# Patient Record
Sex: Male | Born: 1965 | Race: White | Hispanic: No | Marital: Married | Smoking: Never smoker
Health system: Southern US, Community
[De-identification: ages and names within clinical notes are randomized; demographics above are authoritative.]

## PROBLEM LIST (undated history)

## (undated) DIAGNOSIS — E291 Testicular hypofunction: Secondary | ICD-10-CM

## (undated) DIAGNOSIS — I10 Essential (primary) hypertension: Secondary | ICD-10-CM

## (undated) DIAGNOSIS — M1990 Unspecified osteoarthritis, unspecified site: Secondary | ICD-10-CM

## (undated) DIAGNOSIS — E785 Hyperlipidemia, unspecified: Secondary | ICD-10-CM

## (undated) DIAGNOSIS — K429 Umbilical hernia without obstruction or gangrene: Secondary | ICD-10-CM

## (undated) HISTORY — DX: Umbilical hernia without obstruction or gangrene: K42.9

## (undated) HISTORY — DX: Essential (primary) hypertension: I10

## (undated) HISTORY — PX: CORONARY ARTERY BYPASS GRAFT: SHX141

## (undated) HISTORY — PX: OTHER SURGICAL HISTORY: SHX169

## (undated) HISTORY — DX: Testicular hypofunction: E29.1

## (undated) HISTORY — DX: Hyperlipidemia, unspecified: E78.5

## (undated) HISTORY — DX: Unspecified osteoarthritis, unspecified site: M19.90

## (undated) HISTORY — PX: CARDIAC CATHETERIZATION: SHX172

---
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
## 1985-05-15 HISTORY — PX: WRIST SURGERY: SHX841

## 2008-10-13 ENCOUNTER — Ambulatory Visit: Payer: Self-pay | Admitting: Family Medicine

## 2008-10-13 DIAGNOSIS — E785 Hyperlipidemia, unspecified: Secondary | ICD-10-CM

## 2008-10-13 DIAGNOSIS — I10 Essential (primary) hypertension: Secondary | ICD-10-CM | POA: Insufficient documentation

## 2011-04-03 ENCOUNTER — Ambulatory Visit (INDEPENDENT_AMBULATORY_CARE_PROVIDER_SITE_OTHER): Payer: BC Managed Care – PPO | Admitting: General Surgery

## 2011-04-03 ENCOUNTER — Encounter (INDEPENDENT_AMBULATORY_CARE_PROVIDER_SITE_OTHER): Payer: Self-pay | Admitting: General Surgery

## 2011-04-19 ENCOUNTER — Encounter (INDEPENDENT_AMBULATORY_CARE_PROVIDER_SITE_OTHER): Payer: Self-pay | Admitting: General Surgery

## 2011-04-19 ENCOUNTER — Ambulatory Visit (INDEPENDENT_AMBULATORY_CARE_PROVIDER_SITE_OTHER): Payer: BC Managed Care – PPO | Admitting: General Surgery

## 2011-04-19 VITALS — BP 160/92 | HR 60 | Temp 97.0°F | Resp 18 | Ht 70.0 in | Wt 231.1 lb

## 2011-04-19 DIAGNOSIS — K420 Umbilical hernia with obstruction, without gangrene: Secondary | ICD-10-CM

## 2011-04-19 NOTE — Progress Notes (Signed)
Patient ID: Edward Phillips, male   DOB: 09/17/1965, 45 y.o.   MRN: [id number]  Chief Complaint  Patient presents with  . New Evaluation    eval of umbilical hernia    HPI Edward Phillips is a 45 y.o. male.   HPI This patient was seen for evaluation of an umbilical hernia. He states that he first noticed this 2-3 years ago as an umbilical bulge. It has been increasing in size but he does not remember a particular inciting event tocolysis. He does have a history of body building. He states it causes occasional discomfort but is not lifestyle limiting. He states his bowels are normal she denies any nausea vomiting or stroke symptoms. He is in generally good health and does not complain of any shortness of breath and he had a heart catheter approximately 10 years ago at [facility]. Past Medical History  Diagnosis Date  . Hypertension   . Hyperlipidemia     Past Surgical History  Procedure Date  . Nasal septum surgery 1983  . Wrist surgery 1987    tendon surgery   . Cardiac catheterization 2000?    History reviewed. No pertinent family history.  Social History History  Substance Use Topics  . Smoking status: Never Smoker   . Smokeless tobacco: Never Used  . Alcohol Use: Yes    No Known Allergies  Current Outpatient Prescriptions  Medication Sig Dispense Refill  . lisinopril (PRINIVIL,ZESTRIL) 20 MG tablet       . metoprolol (TOPROL-XL) 50 MG 24 hr tablet       . pravastatin (PRAVACHOL) 40 MG tablet         Review of Systems Review of Systems  All other systems reviewed and are negative.    Blood pressure 160/92, pulse 60, temperature 97 F (36.1 C), temperature source Temporal, resp. rate 18, height 5\' 10"  (1.778 m), weight 231 lb 2 oz (104.838 kg).  Physical Exam Physical Exam  Vitals reviewed. Constitutional: He is oriented to person, place, and time. He appears well-developed and well-nourished. No distress.  HENT:  Head: Normocephalic and atraumatic.    Mouth/Throat: No oropharyngeal exudate.  Eyes: Conjunctivae are normal. Pupils are equal, round, and reactive to light. Right eye exhibits no discharge. Left eye exhibits no discharge. No scleral icterus.  Neck: Normal range of motion. Neck supple. No tracheal deviation present.  Cardiovascular: Normal rate, regular rhythm and normal heart sounds.   Pulmonary/Chest: Effort normal and breath sounds normal. No stridor. No respiratory distress. He has no wheezes.  Abdominal: Soft. Bowel sounds are normal. He exhibits mass. He exhibits no distension. There is no tenderness. There is no rebound and no guarding.       Nontender, and partially reducible umbilical hernia  Musculoskeletal: Normal range of motion. He exhibits no edema and no tenderness.  Neurological: He is alert and oriented to person, place, and time. No cranial nerve deficit.  Skin: Skin is warm and dry. No rash noted. He is not diaphoretic. No erythema. No pallor.  Psychiatric: He has a normal mood and affect. His behavior is normal. Judgment and thought content normal.    Data Reviewed   Assessment    Umbilical hernia  He does have a mildly symptomatic umbilical hernia on exam. It is only partially reducible on my exam today. I discussed the options for continued observation versus open or laparoscopic repair and have recommended open repair likely with mesh. We discussed the risks of the procedure including infection, bleeding,  pain, scarring, recurrence, poor cosmesis, bowel injury, and need for future surgery and he expressed understanding. He was not to proceed with open umbilical hernia repair.    Plan    We will schedule him for open umbilical hernia repair with possible mesh when available.       Lodema Pilot DAVID 04/19/2011, 1:48 PM

## 2011-05-04 ENCOUNTER — Telehealth (INDEPENDENT_AMBULATORY_CARE_PROVIDER_SITE_OTHER): Payer: Self-pay

## 2011-05-04 NOTE — Telephone Encounter (Signed)
Left message for patient to call our office regarding appointment (pre-op appt)

## 2011-05-26 ENCOUNTER — Encounter (INDEPENDENT_AMBULATORY_CARE_PROVIDER_SITE_OTHER): Payer: BC Managed Care – PPO | Admitting: General Surgery

## 2011-06-05 ENCOUNTER — Encounter (INDEPENDENT_AMBULATORY_CARE_PROVIDER_SITE_OTHER): Payer: Self-pay | Admitting: General Surgery

## 2014-06-06 ENCOUNTER — Emergency Department (HOSPITAL_BASED_OUTPATIENT_CLINIC_OR_DEPARTMENT_OTHER): Payer: BLUE CROSS/BLUE SHIELD

## 2014-06-06 ENCOUNTER — Emergency Department (HOSPITAL_BASED_OUTPATIENT_CLINIC_OR_DEPARTMENT_OTHER)
Admission: EM | Admit: 2014-06-06 | Discharge: 2014-06-06 | Disposition: A | Discharge status: Home / Self Care | Payer: BLUE CROSS/BLUE SHIELD | Attending: Emergency Medicine | Admitting: Emergency Medicine

## 2014-06-06 ENCOUNTER — Encounter (HOSPITAL_BASED_OUTPATIENT_CLINIC_OR_DEPARTMENT_OTHER): Payer: Self-pay | Admitting: *Deleted

## 2014-06-06 DIAGNOSIS — Z79899 Other long term (current) drug therapy: Secondary | ICD-10-CM | POA: Insufficient documentation

## 2014-06-06 DIAGNOSIS — R109 Unspecified abdominal pain: Secondary | ICD-10-CM

## 2014-06-06 DIAGNOSIS — Z9889 Other specified postprocedural states: Secondary | ICD-10-CM | POA: Insufficient documentation

## 2014-06-06 DIAGNOSIS — I10 Essential (primary) hypertension: Secondary | ICD-10-CM | POA: Diagnosis not present

## 2014-06-06 DIAGNOSIS — E785 Hyperlipidemia, unspecified: Secondary | ICD-10-CM | POA: Insufficient documentation

## 2014-06-06 DIAGNOSIS — N200 Calculus of kidney: Secondary | ICD-10-CM | POA: Diagnosis not present

## 2014-06-06 LAB — CBC WITH DIFFERENTIAL/PLATELET
BASOS PCT: 1 % (ref 0–1)
Basophils Absolute: 0.1 10*3/uL (ref 0.0–0.1)
EOS ABS: 0.1 10*3/uL (ref 0.0–0.7)
EOS PCT: 0 % (ref 0–5)
HEMATOCRIT: 46 % (ref 39.0–52.0)
HEMOGLOBIN: 15.6 g/dL (ref 13.0–17.0)
LYMPHS ABS: 1.8 10*3/uL (ref 0.7–4.0)
Lymphocytes Relative: 14 % (ref 12–46)
MCH: 30.6 pg (ref 26.0–34.0)
MCHC: 33.9 g/dL (ref 30.0–36.0)
MCV: 90.4 fL (ref 78.0–100.0)
Monocytes Absolute: 0.7 10*3/uL (ref 0.1–1.0)
Monocytes Relative: 6 % (ref 3–12)
NEUTROS ABS: 10 10*3/uL — AB (ref 1.7–7.7)
Neutrophils Relative %: 79 % — ABNORMAL HIGH (ref 43–77)
Platelets: 179 10*3/uL (ref 150–400)
RBC: 5.09 MIL/uL (ref 4.22–5.81)
RDW: 12.7 % (ref 11.5–15.5)
WBC: 12.6 10*3/uL — AB (ref 4.0–10.5)

## 2014-06-06 LAB — BASIC METABOLIC PANEL
ANION GAP: 7 (ref 5–15)
BUN: 13 mg/dL (ref 6–23)
CO2: 26 mmol/L (ref 19–32)
CREATININE: 1.02 mg/dL (ref 0.50–1.35)
Calcium: 9.2 mg/dL (ref 8.4–10.5)
Chloride: 103 mmol/L (ref 96–112)
GFR calc non Af Amer: 84 mL/min — ABNORMAL LOW (ref >90)
Glucose, Bld: 134 mg/dL — ABNORMAL HIGH (ref 70–99)
Potassium: 3.8 mmol/L (ref 3.5–5.1)
Sodium: 136 mmol/L (ref 135–145)

## 2014-06-06 LAB — URINALYSIS, ROUTINE W REFLEX MICROSCOPIC
Bilirubin Urine: NEGATIVE
GLUCOSE, UA: NEGATIVE mg/dL
KETONES UR: NEGATIVE mg/dL
Leukocytes, UA: NEGATIVE
Nitrite: NEGATIVE
PH: 7.5 (ref 5.0–8.0)
Protein, ur: 30 mg/dL — AB
Specific Gravity, Urine: 1.02 (ref 1.005–1.030)
Urobilinogen, UA: 0.2 mg/dL (ref 0.0–1.0)

## 2014-06-06 LAB — URINE MICROSCOPIC-ADD ON

## 2014-06-06 MED ORDER — ONDANSETRON HCL 4 MG/2ML IJ SOLN
4.0000 mg | Freq: Once | INTRAMUSCULAR | Status: AC
  Administered 2014-06-06: 4 mg via INTRAVENOUS
  Filled 2014-06-06: qty 2

## 2014-06-06 MED ORDER — HYDROMORPHONE HCL 1 MG/ML IJ SOLN
1.0000 mg | Freq: Once | INTRAMUSCULAR | Status: AC
  Administered 2014-06-06: 1 mg via INTRAVENOUS
  Filled 2014-06-06: qty 1

## 2014-06-06 MED ORDER — KETOROLAC TROMETHAMINE 30 MG/ML IJ SOLN
15.0000 mg | Freq: Once | INTRAMUSCULAR | Status: AC
  Administered 2014-06-06: 15 mg via INTRAVENOUS
  Filled 2014-06-06: qty 1

## 2014-06-06 MED ORDER — KETOROLAC TROMETHAMINE 10 MG PO TABS: 10.0000 mg | ORAL_TABLET | Freq: Four times a day (QID) | ORAL | Status: DC | PRN

## 2014-06-06 MED ORDER — SODIUM CHLORIDE 0.9 % IV SOLN
INTRAVENOUS | Status: DC
  Administered 2014-06-06: 15:00:00 via INTRAVENOUS

## 2014-06-06 MED ORDER — HYDROMORPHONE HCL 1 MG/ML IJ SOLN
0.5000 mg | Freq: Once | INTRAMUSCULAR | Status: AC
  Administered 2014-06-06: 0.5 mg via INTRAVENOUS
  Filled 2014-06-06: qty 1

## 2014-06-06 MED ORDER — ONDANSETRON HCL 4 MG PO TABS: 4.0000 mg | ORAL_TABLET | Freq: Three times a day (TID) | ORAL | Status: DC | PRN

## 2014-06-06 MED ORDER — OXYCODONE-ACETAMINOPHEN 5-325 MG PO TABS: ORAL_TABLET | ORAL | Status: DC

## 2014-06-06 NOTE — ED Notes (Addendum)
Pt reports L back pain radiating to L groin that began around 1215 today; reports hx of kidney stones (reports it feels the same); reports blood in urine x1, difficult and painful urination; reports n/v; denies fever. Pt received Fentanyl 250 mcg IV in route.

## 2014-06-06 NOTE — Discharge Instructions (Signed)
Push fluids: take small frequent sips of water or Gatorade, do not drink any soda, juice or caffeinated beverages.  Take percocet for breakthrough pain, do not drink alcohol, drive, care for children or do other critical tasks while taking percocet.  Do not combine ketorolac (toradol) with any other NSAID (motrin, ibuprofen, Advil, aleve , aspirin, naproxen etc.) Take fist ketorolac pill tomorrow, you have already had a shot today  Return to the ED if you develop fever, have vomiting that is not controlled with the medication or if pain becomes too severe.  Please follow with your primary care doctor in the next 2 days for a check-up. They must obtain records for further management.   Do not hesitate to return to the Emergency Department for any new, worsening or concerning symptoms.

## 2014-06-06 NOTE — ED Provider Notes (Signed)
CSN: [id number]     Arrival date & time 06/06/14  1412 History   First MD Initiated Contact with Patient 06/06/14 1422     Chief Complaint  Patient presents with  . Abdominal Pain     (Consider location/radiation/quality/duration/timing/severity/associated sxs/prior Treatment) HPI   Edward Phillips is a 49 y.o. male complaining of severe left flank pain radiating around to the left groin onset at 12:15 today associated with hematuria feels like prior kidney stones. Patient had to have surgical intervention for kidney stones in the past. [place]. Endorses nausea with no vomiting. Patient denies fever or chills, dysuria.   Past Medical History  Diagnosis Date  . Hypertension   . Hyperlipidemia    Past Surgical History  Procedure Laterality Date  . Nasal septum surgery  1983  . Wrist surgery  1987    tendon surgery   . Cardiac catheterization  2000?   No family history on file. History  Substance Use Topics  . Smoking status: Never Smoker   . Smokeless tobacco: Never Used  . Alcohol Use: Yes     Comment: occasional    Review of Systems  10 systems reviewed and found to be negative, except as noted in the HPI.   Allergies  Review of patient's allergies indicates no known allergies.  Home Medications   Prior to Admission medications   Medication Sig Start Date End Date Taking? Authorizing Provider  FLUoxetine (PROZAC) 40 MG capsule Take 40 mg by mouth daily.   Yes Historical Provider, MD  lisinopril (PRINIVIL,ZESTRIL) 20 MG tablet  04/14/11   Historical Provider, MD  metoprolol (TOPROL-XL) 50 MG 24 hr tablet  04/14/11   Historical Provider, MD  pravastatin (PRAVACHOL) 40 MG tablet  04/19/11   Historical Provider, MD   BP 188/95 mmHg  Pulse 85  Temp(Src) 98.7 F (37.1 C) (Oral)  Resp 20  Ht 5\' 10"  (1.778 m)  Wt 220 lb (99.791 kg)  BMI 31.57 kg/m2  SpO2 95% Physical Exam  Constitutional: He is oriented to person, place, and time.  He appears well-developed and well-nourished.  Writhing,  Appears uncomfortable  HENT:  Head: Normocephalic and atraumatic.  Mouth/Throat: Oropharynx is clear and moist.  Eyes: Conjunctivae and EOM are normal. Pupils are equal, round, and reactive to light.  Neck: Normal range of motion.  Cardiovascular: Normal rate, regular rhythm and intact distal pulses.   Pulmonary/Chest: Effort normal and breath sounds normal. No stridor. No respiratory distress. He has no wheezes. He has no rales. He exhibits no tenderness.  Abdominal: Soft. Bowel sounds are normal. He exhibits no distension and no mass. There is no tenderness. There is no rebound and no guarding.  Genitourinary:  No CVA tenderness bilaterally  Musculoskeletal: Normal range of motion.  Neurological: He is alert and oriented to person, place, and time.  Psychiatric: He has a normal mood and affect.  Nursing note and vitals reviewed.   ED Course  Procedures (including critical care time) Labs Review Labs Reviewed  CBC WITH DIFFERENTIAL/PLATELET - Abnormal; Notable for the following:    WBC 12.6 (*)    Neutrophils Relative % 79 (*)    Neutro Abs 10.0 (*)    All other components within normal limits  BASIC METABOLIC PANEL - Abnormal; Notable for the following:    Glucose, Bld 134 (*)    GFR calc non Af Amer 84 (*)    All other components within normal limits  URINALYSIS, ROUTINE W REFLEX MICROSCOPIC -  Abnormal; Notable for the following:    APPearance CLOUDY (*)    Hgb urine dipstick LARGE (*)    Protein, ur 30 (*)    All other components within normal limits  URINE MICROSCOPIC-ADD ON - Abnormal; Notable for the following:    Bacteria, UA MANY (*)    Crystals CA OXALATE CRYSTALS (*)    All other components within normal limits    Imaging Review No results found.   EKG Interpretation None      MDM   Final diagnoses:  Left flank pain  Kidney stone on left side    Filed Vitals:   06/06/14 1414 06/06/14 1422  06/06/14 1547 06/06/14 1605  BP: 188/95  184/105 160/91  Pulse: 85  94   Temp: 98.7 F (37.1 C)  98.5 F (36.9 C)   TempSrc: Oral  Oral   Resp: 20  24   Height:   (1.778 m)    Weight:  220 lb (99.791 kg)    SpO2: 95%  96%     Medications  0.9 %  sodium chloride infusion ( Intravenous Stopped 06/06/14 1618)  HYDROmorphone (DILAUDID) injection 0.5 mg (0.5 mg Intravenous Given 06/06/14 1438)  ondansetron (ZOFRAN) injection 4 mg (4 mg Intravenous Given 06/06/14 1436)  HYDROmorphone (DILAUDID) injection 1 mg (1 mg Intravenous Given 06/06/14 1514)  ketorolac (TORADOL) 30 MG/ML injection 15 mg (15 mg Intravenous Given 06/06/14 1545)    Edward Phillips is a pleasant 49 y.o. male presenting with flank pain similar to prior episodes of kidney stones. Patient is afebrile, well-appearing. Urinalysis is not consistent with infection, no leukocytes or nitrite however there are many bacteria. Urinalysis will be cultured. Patient's pain is controlled to 5 out of 10 with Dilaudid and Toradol. Patient follows with urology in [place].  Evaluation does not show pathology that would require ongoing emergent intervention or inpatient treatment. Pt is hemodynamically stable and mentating appropriately. Discussed findings and plan with patient/guardian, who agrees with care plan. All questions answered. Return precautions discussed and outpatient follow up given.   Discharge Medication List as of 06/06/2014  4:06 PM    START taking these medications   Details  ketorolac (TORADOL) 10 MG tablet Take 1 tablet (10 mg total) by mouth every 6 (six) hours as needed (Take with food. Do not take more than 4 per day. Do not take for longer than 5 days)., Starting 06/06/2014, Until Discontinued, Print    ondansetron (ZOFRAN) 4 MG tablet Take 1 tablet (4 mg total) by mouth every 8 (eight) hours as needed for nausea or vomiting., Starting 06/06/2014, Until Discontinued, Print    oxyCODONE-acetaminophen  (PERCOCET/ROXICET) 5-325 MG per tablet 1 to 2 tabs PO q4-6hrs  PRN for pain, Print             Wynetta Emery, PA-C 06/06/14 1710  Hilario Quarry, MD 06/09/14 [phone]

## 2014-06-07 LAB — URINE CULTURE
Colony Count: NO GROWTH
Culture: NO GROWTH

## 2016-03-07 IMAGING — CT CT RENAL STONE PROTOCOL
2 of 4 series · 16 of 46 positions shown, 18 images · non-contrast
Comparison: Abdominal pelvic CT 05/14/2006.

CLINICAL DATA: Left flank pain radiating into the left groin with
hematuria and difficulty urinating. History of kidney stones.
Initial encounter.

EXAM:
CT ABDOMEN AND PELVIS WITHOUT CONTRAST
TECHNIQUE: Multidetector CT imaging of the abdomen and pelvis was performed
following the standard protocol without IV contrast.

[Series 2: renal stone > 200 lbs 5.0 b31f · axial · 0.79mm/px · z∈[-526,-76]mm · 13 of 100 slices shown, 15 images]
[im 5/100  soft-tissue]
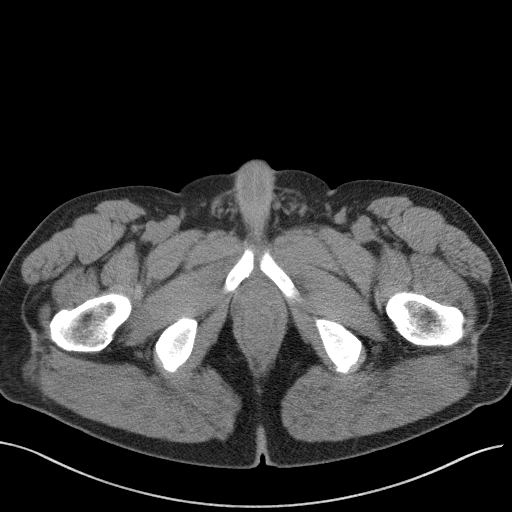
[im 5/100  bone]
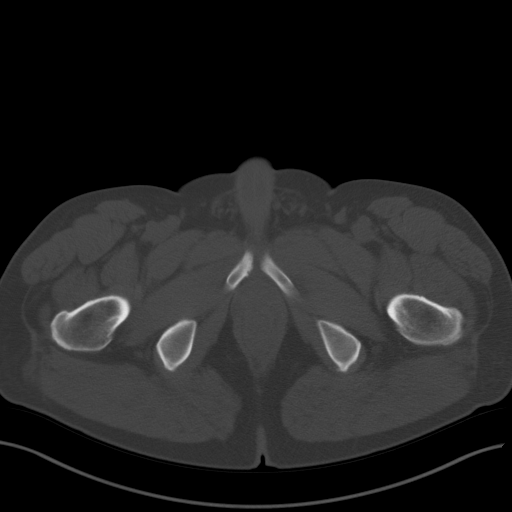
[im 13/100  soft-tissue]
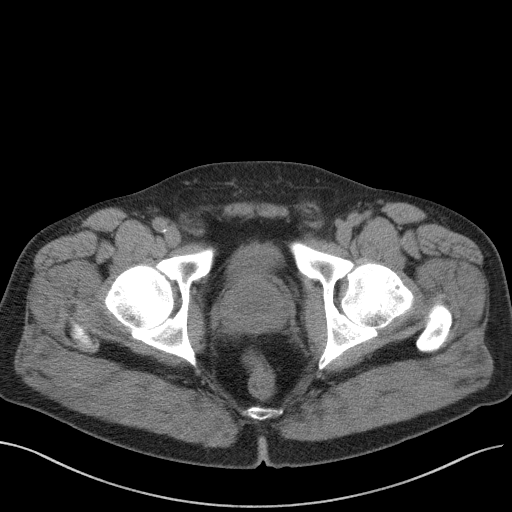
[im 21/100  soft-tissue]
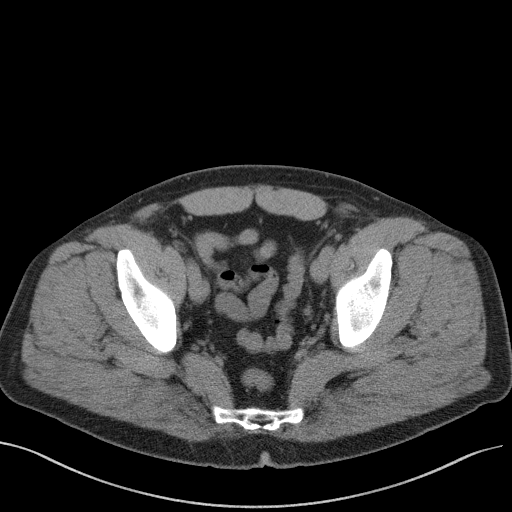
[im 29/100  soft-tissue]
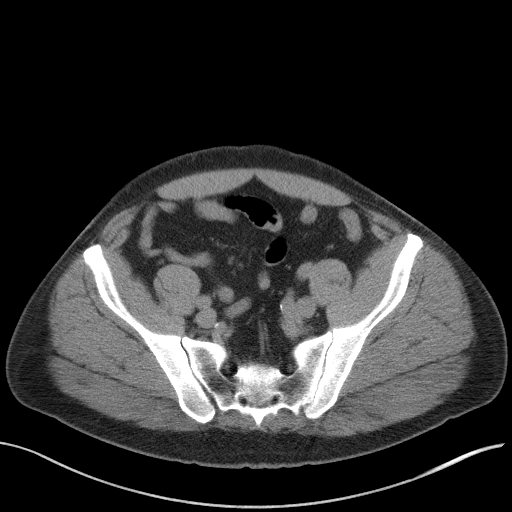
[im 34/100  soft-tissue]
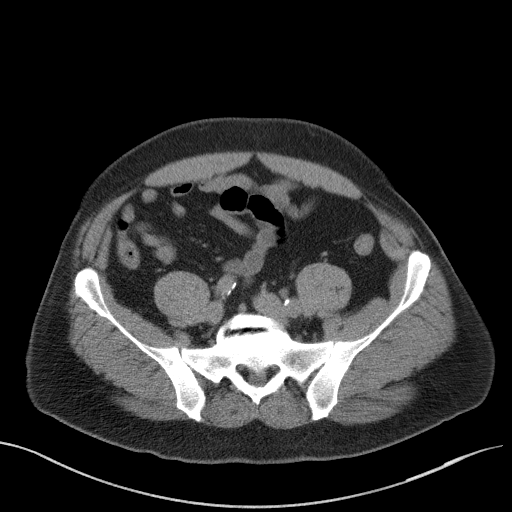
[im 42/100  soft-tissue]
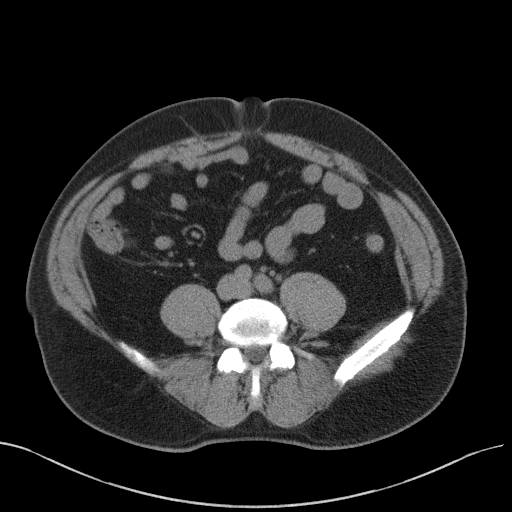
[im 50/100  soft-tissue]
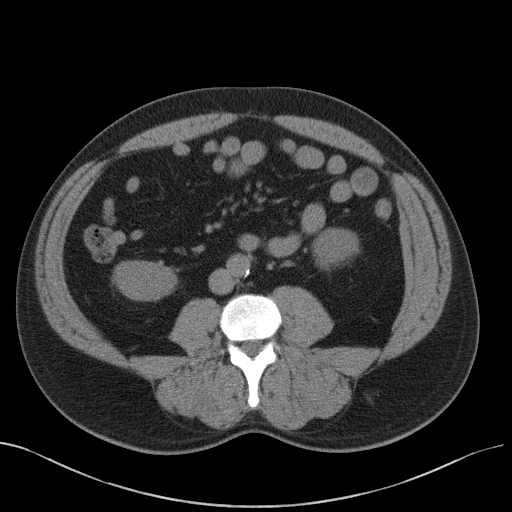
[im 58/100  soft-tissue]
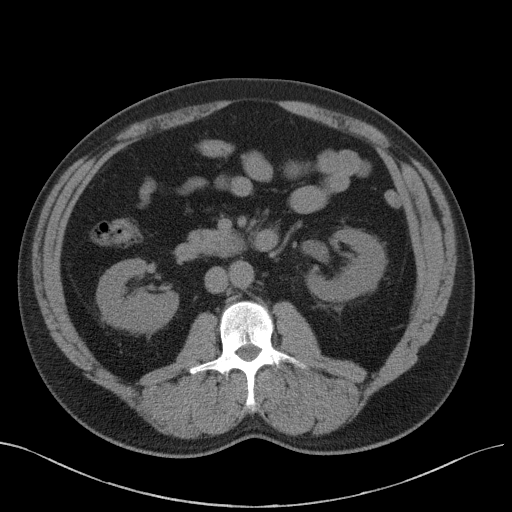
[im 67/100  soft-tissue]
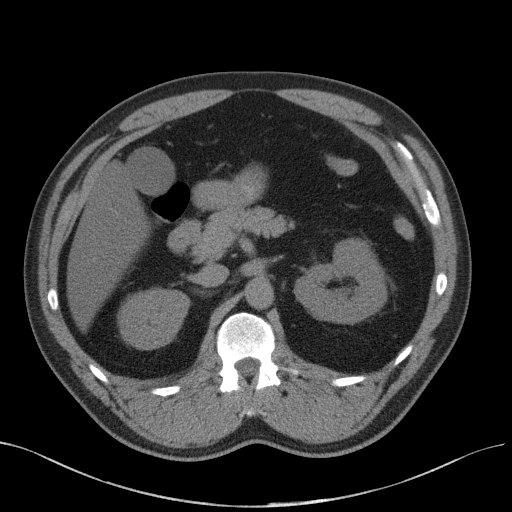
[im 67/100  bone]
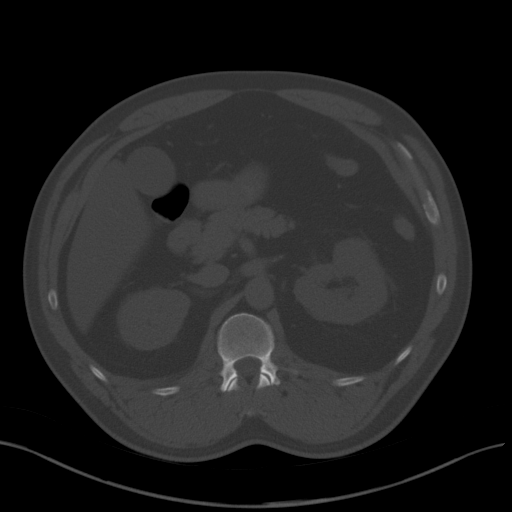
[im 71/100  soft-tissue]
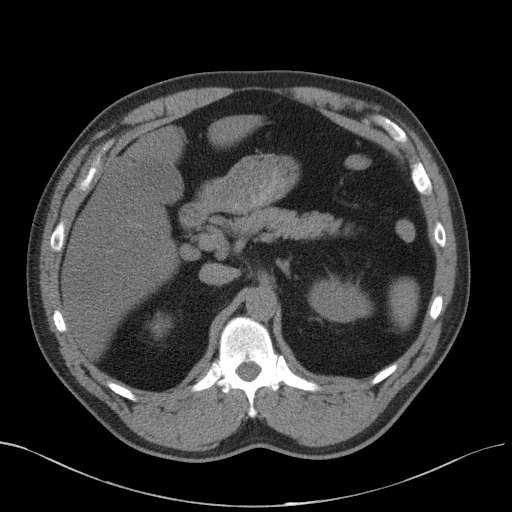
[im 79/100  soft-tissue]
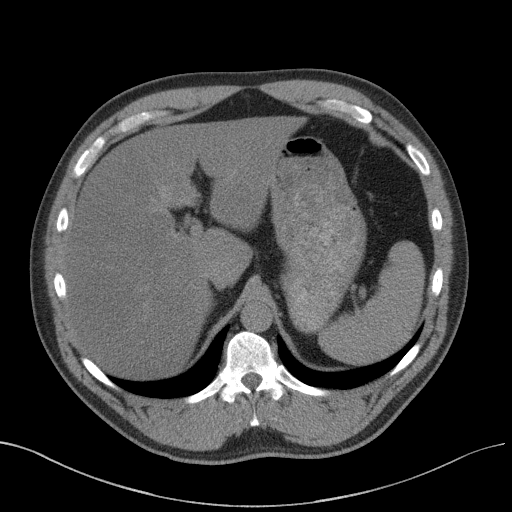
[im 87/100  soft-tissue]
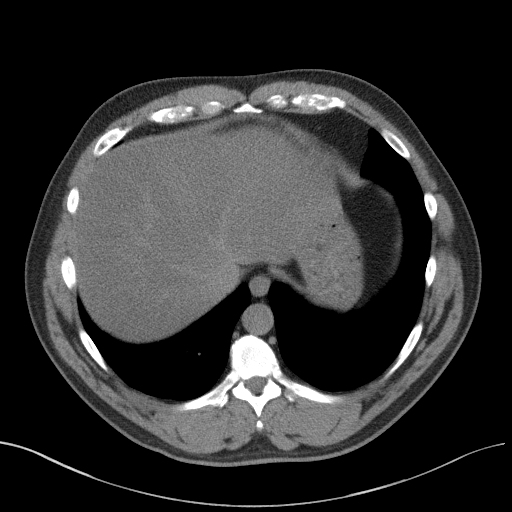
[im 95/100  soft-tissue]
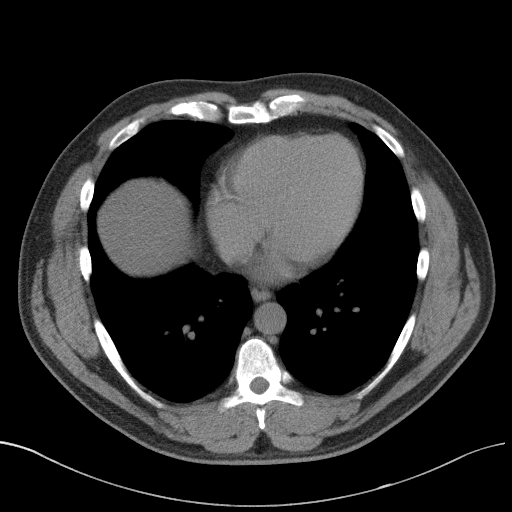

[Series 5: renal stone 3.0 coronal · coronal · 0.87mm/px · 3 of 103 slices shown]
[im 35/103  soft-tissue]
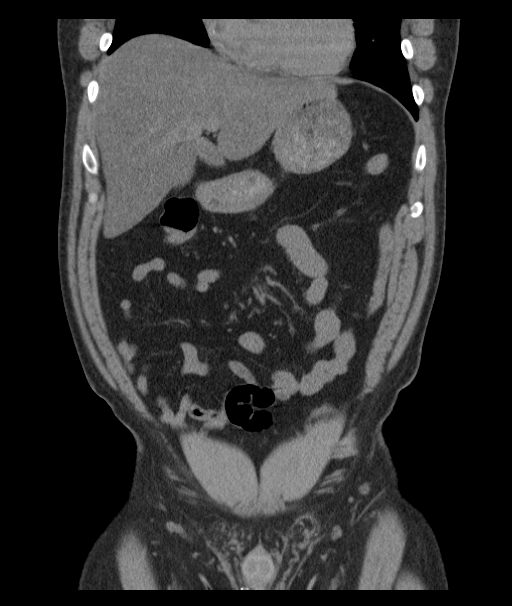
[im 46/103  soft-tissue]
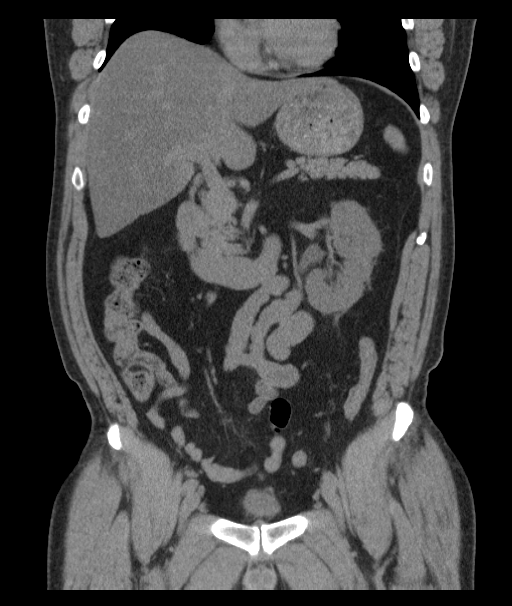
[im 57/103  soft-tissue]
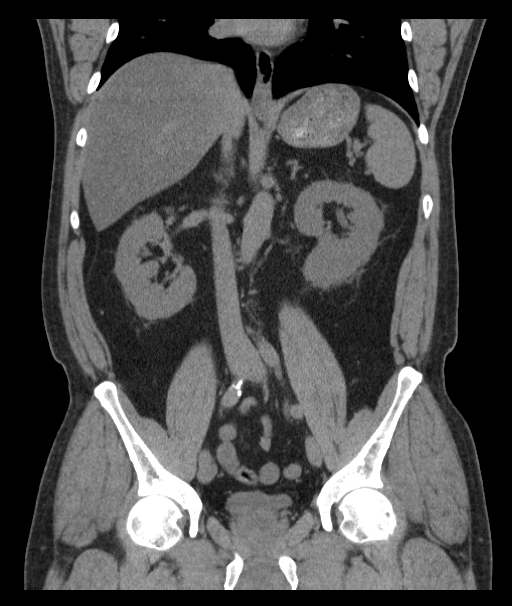

[16 of 46 positions shown; findings below may reference images not displayed]

FINDINGS: Lower chest: Clear lung bases. No significant pleural or pericardial
effusion.

Hepatobiliary: Diffuse hepatic steatosis has progressed compared
with the prior study and appears mildly heterogeneous. No focal
lesions are identified on non contrast imaging. No evidence of
gallstones, gallbladder wall thickening or biliary dilatation.

Pancreas: Unremarkable. No pancreatic ductal dilatation or
surrounding inflammatory changes.

Spleen: Normal in size without focal abnormality.

Adrenals/Urinary Tract: Both adrenal glands appear normal.There is
new mild left-sided hydronephrosis and perinephric soft tissue
stranding secondary to an obstructing 4 mm calculus at the left
ureterovesical junction (axial image 85). This is not well seen on
the scout image due to overlap with the sacrum. There is a tiny
calculus in the interpolar region of the right kidney, best seen on
coronal image number 61. No other urinary tract calculi
demonstrated. The right kidney appears normal. The bladder is nearly
empty but otherwise appears unremarkable.

Stomach/Bowel: No evidence of bowel wall thickening, distention or
surrounding inflammatory change.The appendix appears normal. There
is no extraluminal fluid collection.

Vascular/Lymphatic: There are no enlarged abdominal or pelvic lymph
nodes. There is stable minimal aortoiliac atherosclerosis.

Reproductive: Stable mild enlargement of the prostate gland with
central dystrophic calcifications. Vasectomy clips noted.

Other: There has been mild interval enlargement of an umbilical
hernia containing fat and fluid. There is no herniated bowel.

Musculoskeletal: No acute or significant osseous findings.
Degenerative disc disease at L5-S1 is noted.
IMPRESSION: 1. Obstructing 4 mm calculus at the left ureterovesical junction.
2. Tiny nonobstructing right renal calculus.
3. Hepatic steatosis.
4. Interval enlargement of small umbilical hernia containing fat and
fluid.

## 2023-02-05 ENCOUNTER — Encounter: Payer: Self-pay | Admitting: Hematology and Oncology

## 2023-02-05 ENCOUNTER — Inpatient Hospital Stay: Payer: Managed Care, Other (non HMO)

## 2023-02-05 ENCOUNTER — Inpatient Hospital Stay: Payer: Managed Care, Other (non HMO) | Attending: Hematology and Oncology | Admitting: Hematology and Oncology

## 2023-02-05 DIAGNOSIS — R21 Rash and other nonspecific skin eruption: Secondary | ICD-10-CM

## 2023-02-05 DIAGNOSIS — D696 Thrombocytopenia, unspecified: Secondary | ICD-10-CM | POA: Diagnosis not present

## 2023-02-05 DIAGNOSIS — Z79899 Other long term (current) drug therapy: Secondary | ICD-10-CM | POA: Insufficient documentation

## 2023-02-05 DIAGNOSIS — Z7902 Long term (current) use of antithrombotics/antiplatelets: Secondary | ICD-10-CM | POA: Diagnosis not present

## 2023-02-05 DIAGNOSIS — R748 Abnormal levels of other serum enzymes: Secondary | ICD-10-CM | POA: Insufficient documentation

## 2023-02-05 DIAGNOSIS — R790 Abnormal level of blood mineral: Secondary | ICD-10-CM | POA: Diagnosis present

## 2023-02-05 LAB — CBC WITH DIFFERENTIAL (CANCER CENTER ONLY)
Abs Immature Granulocytes: 0.03 10*3/uL (ref 0.00–0.07)
Basophils Absolute: 0 10*3/uL (ref 0.0–0.1)
Basophils Relative: 1 %
Eosinophils Absolute: 0.1 10*3/uL (ref 0.0–0.5)
Eosinophils Relative: 2 %
HCT: 42.5 % (ref 39.0–52.0)
Hemoglobin: 14.5 g/dL (ref 13.0–17.0)
Immature Granulocytes: 1 %
Lymphocytes Relative: 23 %
Lymphs Abs: 0.9 10*3/uL (ref 0.7–4.0)
MCH: 30.7 pg (ref 26.0–34.0)
MCHC: 34.1 g/dL (ref 30.0–36.0)
MCV: 90 fL (ref 80.0–100.0)
Monocytes Absolute: 0.5 10*3/uL (ref 0.1–1.0)
Monocytes Relative: 12 %
Neutro Abs: 2.4 10*3/uL (ref 1.7–7.7)
Neutrophils Relative %: 61 %
Platelet Count: 113 10*3/uL — ABNORMAL LOW (ref 150–400)
RBC: 4.72 MIL/uL (ref 4.22–5.81)
RDW: 12.7 % (ref 11.5–15.5)
WBC Count: 4 10*3/uL (ref 4.0–10.5)
nRBC: 0 % (ref 0.0–0.2)

## 2023-02-05 NOTE — Assessment & Plan Note (Signed)
The patient has evidence of very high ferritin level dated back to 2021 when the patient had COVID-pneumonia The elevated ferritin is not new but certainly could be elevated in the setting of infection in 2021 and now due to inflammatory condition We discussed risk and benefits of ordering DNA test to rule out hemochromatosis and ultimately the patient agreed to proceed

## 2023-02-05 NOTE — Assessment & Plan Note (Signed)
He has very peculiar rash that is incredibly itchy Multiple tests in progress I will order IgE level

## 2023-02-05 NOTE — Assessment & Plan Note (Signed)
Multifactorial, cause is unknown The patient is undergoing extensive rheumatology workup that could be causing thrombocytopenia No contraindication for him to continue taking Plavix for now as he is not symptomatic

## 2023-02-05 NOTE — Progress Notes (Signed)
[facility] CONSULT NOTE  Patient Care Team: Kirke Corin, DO as PCP - General (Family Medicine)  ASSESSMENT & PLAN Iron overload The patient has evidence of very high ferritin level dated back to 2021 when the patient had COVID-pneumonia The elevated ferritin is not new but certainly could be elevated in the setting of infection in 2021 and now due to inflammatory condition We discussed risk and benefits of ordering DNA test to rule out hemochromatosis and ultimately the patient agreed to proceed  Thrombocytopenia (HCC) Multifactorial, cause is unknown The patient is undergoing extensive rheumatology workup that could be causing thrombocytopenia No contraindication for him to continue taking Plavix for now as he is not symptomatic  Skin rash He has very peculiar rash that is incredibly itchy Multiple tests in progress I will order IgE level  Abnormal liver enzymes This is chronic, multifactorial Could also be related to iron overload Observe closely for now  Orders Placed This Encounter  Procedures   Hemochromatosis DNA, PCR    Standing Status:   Future    Number of Occurrences:   1    Standing Expiration Date:   02/05/2024   CBC with Differential (Cancer Center Only)    Standing Status:   Future    Number of Occurrences:   1    Standing Expiration Date:   02/05/2024   IgE    Standing Status:   Future    Number of Occurrences:   1    Standing Expiration Date:   02/05/2024   All questions were answered. The patient knows to call the clinic with any problems, questions or concerns. No barriers to learning was detected. The total time spent in the appointment was 60 minutes encounter with patients including review of chart and various tests results, discussions about plan of care and coordination of care plan  Artis Delay, MD 02/05/2023 1:50 PM  CHIEF COMPLAINTS/PURPOSE OF CONSULTATION:  High ferritin, thrombocytopenia, significant skin itching  HISTORY OF  PRESENTING ILLNESS:  Edward Phillips 57 y.o. male is here because of high ferritin, thrombocytopenia and skin itching The patient is here accompanied by his wife The patient is in good health until around July of this year when he was surveying lands to look for a site to build his home He has good memory recall Soon after his visit to a plot of land on December 01, 2022, he developed significant skin itching all over his body.  Initially, he felt that it was triggered by bug bites but soon after, it got worse and nonresolving.  He was prescribed doxycycline and took it for several weeks for possible exposure to tick bites but that did not help.  He also placed on multiple courses of corticosteroids and steroid injection without resolution of his skin itchiness. Ultimately, he had extensive blood work and was noted to have mild thrombocytopenia, thought to be related to platelet clumping per outside labs as well as very high ferritin level.  His ANA came back abnormal and he was referred to see rheumatologist  He was subsequently referred here for further evaluation for thrombocytopenia and high ferritin level  The patient had donated a lot of blood in the past but none since he graduated from college.  There is no family history to suggest of inheritable iron overload syndrome such as hemochromatosis.  His mother died from heart disease/diabetes related complication at the age of 25.  His father had melanoma and subsequently died around the age of  81 and also suffer from dementia.  He has 1 sister with breast cancer diagnosed around the age of 98. He denies recent bruising/bleeding, such as spontaneous epistaxis, hematuria, melena or hematochezia The patient denies history of liver disease, although on review of his blood work, the patient had borderline chronic elevated liver enzymes for many years On review of his electronic records dated back to 2021, he had very high ferritin level In June 2021, his  ferritin was 1600 On January 01, 2023, his ferritin was 1727 with normal iron saturation and total iron binding capacity  MEDICAL HISTORY:  Past Medical History:  Diagnosis Date   Hyperlipidemia    Hypertension    Osteoarthritis    Testosterone deficiency in male    Umbilical hernia     SURGICAL HISTORY: Past Surgical History:  Procedure Laterality Date   CARDIAC CATHETERIZATION  2000?   CORONARY ARTERY BYPASS GRAFT     coronary stent     NASAL SEPTUM SURGERY  05/15/1981   WRIST SURGERY  05/15/1985   tendon surgery     SOCIAL HISTORY: Social History   Socioeconomic History   Marital status: Married    Spouse name: Not on file   Number of children: 3   Years of education: Not on file   Highest education level: Not on file  Occupational History   Occupation: Production designer, theatre/television/film  Tobacco Use   Smoking status: Never   Smokeless tobacco: Never  Substance and Sexual Activity   Alcohol use: Yes    Comment: occasional   Drug use: No   Sexual activity: Not on file  Other Topics Concern   Not on file  Social History Narrative   Not on file   Social Determinants of Health   Financial Resource Strain: Low Risk  (08/15/2022)   Received from [facility]   Overall Financial Resource Strain (CARDIA)    Difficulty of Paying Living Expenses: Not hard at all  Food Insecurity: No Food Insecurity (08/15/2022)   Received from [facility]   Hunger Vital Sign    Worried About Running Out of Food in the Last Year: Never true    Ran Out of Food in the Last Year: Never true  Transportation Needs: No Transportation Needs (08/15/2022)   Received from [facility] - Transportation    Lack of Transportation (Medical): No    Lack of Transportation (Non-Medical): No  Physical Activity: Insufficiently Active (08/15/2022)   Received from [facility]   Exercise Vital Sign    Days of Exercise per Week: 3 days    Minutes of Exercise per Session: 30 min  Stress: No Stress Concern Present  (08/15/2022)   Received from [facility] - Occupational Stress Questionnaire    Feeling of Stress : Not at all  Social Connections: Socially Integrated (08/15/2022)   Received from [facility]   Social Network    How would you rate your social network (family, work, friends)?: Good participation with social networks  Intimate Partner Violence: Not At Risk (08/15/2022)   Received from [facility]   HITS    Over the last 12 months how often did your partner physically hurt you?: 1    Over the last 12 months how often did your partner insult you or talk down to you?: 1    Over the last 12 months how often did your partner threaten you with physical harm?: 1    Over the last 12  months how often did your partner scream or curse at you?: 1    FAMILY HISTORY: Family History  Problem Relation Age of Onset   Diabetes Mother    Heart disease Mother    Cancer Father        melanoma   Dementia Father    Cancer Sister        breast ca    ALLERGIES:  has No Known Allergies.  MEDICATIONS:  Current Outpatient Medications  Medication Sig Dispense Refill   atorvastatin (LIPITOR) 40 MG tablet Take 40 mg by mouth daily.     Cholecalciferol (VITAMIN D) 50 MCG (2000 UT) CAPS Take 1 capsule by mouth daily.     clopidogrel (PLAVIX) 75 MG tablet Take 75 mg by mouth daily.     fenofibrate (TRICOR) 145 MG tablet Take 145 mg by mouth daily.     LORazepam (ATIVAN) 1 MG tablet Take 1 mg by mouth daily.     metoprolol succinate (TOPROL-XL) 25 MG 24 hr tablet Take 25 mg by mouth daily.     tadalafil (CIALIS) 5 MG tablet Take 5 mg by mouth daily as needed.     No current facility-administered medications for this visit.    REVIEW OF SYSTEMS: He complained of excessive fatigue.  He has lost about 10 pounds Constitutional: Denies fevers, chills or abnormal night sweats Eyes: Denies blurriness of vision, double vision or watery eyes Ears, nose, mouth, throat,  and face: Denies mucositis or sore throat Respiratory: Denies cough, dyspnea or wheezes Cardiovascular: Denies palpitation, chest discomfort or lower extremity swelling Gastrointestinal:  Denies nausea, heartburn or change in bowel habits Lymphatics: Denies new lymphadenopathy or easy bruising Neurological:Denies numbness, tingling or new weaknesses Behavioral/Psych: Mood is stable, no new changes  All other systems were reviewed with the patient and are negative.  PHYSICAL EXAMINATION: ECOG PERFORMANCE STATUS: 1 - Symptomatic but completely ambulatory  Vitals:   02/05/23 1002  BP: 119/71  Pulse: (!) 56  Resp: 18  Temp: 97.8 F (36.6 C)  SpO2: 100%   Filed Weights   02/05/23 1002  Weight: 220 lb 3.2 oz (99.9 kg)    GENERAL:alert, no distress and comfortable SKIN: He has erythematous changes to his skin but no definitive rash, predominantly affected his upper arms EYES: normal, conjunctiva are pink and non-injected, sclera clear OROPHARYNX:no exudate, no erythema and lips, buccal mucosa, and tongue normal  NECK: supple, thyroid normal size, non-tender, without nodularity LYMPH:  no palpable lymphadenopathy in the cervical, axillary or inguinal LUNGS: clear to auscultation and percussion with normal breathing effort HEART: regular rate & rhythm and no murmurs and no lower extremity edema ABDOMEN:abdomen soft, non-tender and normal bowel sounds Musculoskeletal:no cyanosis of digits and no clubbing  PSYCH: alert & oriented x 3 with fluent speech NEURO: no focal motor/sensory deficits  LABORATORY DATA:  I have reviewed the data as listed Lab Results  Component Value Date   WBC 4.0 02/05/2023   HGB 14.5 02/05/2023   HCT 42.5 02/05/2023   MCV 90.0 02/05/2023   PLT 113 (L) 02/05/2023   I have reviewed his peripheral blood smear.  Normal white blood cell and red blood cell morphology.  I do not appreciate any signs of platelet clumping.  Occasional large platelets are  noted No schistocytes

## 2023-02-05 NOTE — Assessment & Plan Note (Signed)
This is chronic, multifactorial Could also be related to iron overload Observe closely for now

## 2023-02-07 LAB — IGE: IgE (Immunoglobulin E), Serum: 11 IU/mL (ref 6–495)

## 2023-02-19 ENCOUNTER — Telehealth: Payer: Self-pay

## 2023-02-19 LAB — HEMOCHROMATOSIS DNA-PCR(C282Y,H63D)

## 2023-02-19 NOTE — Telephone Encounter (Signed)
Called and left a message appt for tomorrow canceled. [facility] is reporting a 7 day delay for lab test. ETA for result is 02/23/23.  Rescheduled appt to 10/14 at 11 am. Ask him to call the office back if this appt does not work or if he has questions.

## 2023-02-20 ENCOUNTER — Encounter: Payer: Self-pay | Admitting: Hematology and Oncology

## 2023-02-20 ENCOUNTER — Telehealth: Payer: Self-pay

## 2023-02-20 ENCOUNTER — Inpatient Hospital Stay: Payer: Managed Care, Other (non HMO)

## 2023-02-20 ENCOUNTER — Other Ambulatory Visit: Payer: Managed Care, Other (non HMO)

## 2023-02-20 ENCOUNTER — Inpatient Hospital Stay: Payer: Managed Care, Other (non HMO) | Attending: Hematology and Oncology | Admitting: Hematology and Oncology

## 2023-02-20 ENCOUNTER — Inpatient Hospital Stay: Payer: Managed Care, Other (non HMO) | Admitting: Hematology and Oncology

## 2023-02-20 DIAGNOSIS — Z7902 Long term (current) use of antithrombotics/antiplatelets: Secondary | ICD-10-CM | POA: Diagnosis not present

## 2023-02-20 DIAGNOSIS — Z79899 Other long term (current) drug therapy: Secondary | ICD-10-CM | POA: Insufficient documentation

## 2023-02-20 DIAGNOSIS — Z803 Family history of malignant neoplasm of breast: Secondary | ICD-10-CM | POA: Insufficient documentation

## 2023-02-20 DIAGNOSIS — R21 Rash and other nonspecific skin eruption: Secondary | ICD-10-CM | POA: Insufficient documentation

## 2023-02-20 DIAGNOSIS — D696 Thrombocytopenia, unspecified: Secondary | ICD-10-CM | POA: Insufficient documentation

## 2023-02-20 NOTE — Assessment & Plan Note (Signed)
Likely related to his liver disease Will monitor closely

## 2023-02-20 NOTE — Assessment & Plan Note (Signed)
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

## 2023-02-20 NOTE — Progress Notes (Signed)
Edward Phillips presents today for phlebotomy per MD orders. 16g phlebotomy kit used. Phlebotomy procedure started at 1205 and ended at 1212. 549 grams removed. Patient observed for 30 minutes after procedure without any incident, pt provided with drink and snack. Patient tolerated procedure well. IV needle removed intact. VSS at d/c.

## 2023-02-20 NOTE — Progress Notes (Signed)
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 It is not clear to me that is the cause of his skin itching We discussed risk, benefits, side effects of phlebotomy and he agreed to proceed I recommend phlebotomy today followed by phlebotomy every 2 weeks I will monitor his ferritin once a month I will see him again in 3 months for further follow-up  Skin rash Skin rash and itching has been investigated for a long time It is not clear to me this is related to hemochromatosis  Thrombocytopenia (HCC) Likely related to his liver disease Will monitor closely  Orders Placed This Encounter  Procedures   CBC with Differential/Platelet    Standing Status:   Standing    Number of Occurrences:   22    Standing Expiration Date:   02/20/2024   Ferritin    Standing Status:   Standing    Number of Occurrences:   11    Standing Expiration Date:   02/20/2024    The total time spent in the appointment was 30 minutes encounter with patients including review of chart and various tests results, discussions about plan of care and coordination of care plan   All questions were answered. The patient knows to call the clinic with any problems, questions or concerns. No barriers to learning was detected.    Artis Delay, MD 10/8/20242:40 PM  INTERVAL HISTORY: Edward Phillips 57 y.o. male returns for review test results We discussed test results and I gave him copy of his genetic test for hemochromatosis We discussed risk and benefits of phlebotomy and risk and benefits of screening for family members for hemochromatosis  SUMMARY OF HEMATOLOGIC HISTORY:  Edward Phillips 57 y.o. male is here because of high ferritin, thrombocytopenia and skin itching The patient is here  accompanied by his wife The patient is in good health until around July of this year when he was surveying lands to look for a site to build his home He has good memory recall Soon after his visit to a plot of land on December 01, 2022, he developed significant skin itching all over his body.  Initially, he felt that it was triggered by bug bites but soon after, it got worse and nonresolving.  He was prescribed doxycycline and took it for several weeks for possible exposure to tick bites but that did not help.  He also placed on multiple courses of corticosteroids and steroid injection without resolution of his skin itchiness. Ultimately, he had extensive blood work and was noted to have mild thrombocytopenia, thought to be related to platelet clumping per outside labs as well as very high ferritin level.  His ANA came back abnormal and he was referred to see rheumatologist  He was subsequently referred here for further evaluation for thrombocytopenia and high ferritin level  The patient had donated a lot of blood in the past but none since he graduated from college.  There is no family history to suggest of inheritable iron overload syndrome such as hemochromatosis.  His mother died from heart disease/diabetes related complication at the age of 74.  His father had melanoma and subsequently died around the age of 23 and also suffer from dementia.  He has 1 sister with breast cancer diagnosed around the age of  51. He denies recent bruising/bleeding, such as spontaneous epistaxis, hematuria, melena or hematochezia The patient denies history of liver disease, although on review of his blood work, the patient had borderline chronic elevated liver enzymes for many years On review of his electronic records dated back to 2021, he had very high ferritin level In June 2021, his ferritin was 1600 On January 01, 2023, his ferritin was 1727 with normal iron saturation and total iron binding capacity PCR test reviewed  abnormal mutation consistent with hemochromatosis  I have reviewed the past medical history, past surgical history, social history and family history with the patient and they are unchanged from previous note.  ALLERGIES:  has No Known Allergies.  MEDICATIONS:  Current Outpatient Medications  Medication Sig Dispense Refill   atorvastatin (LIPITOR) 40 MG tablet Take 40 mg by mouth daily.     Cholecalciferol (VITAMIN D) 50 MCG (2000 UT) CAPS Take 1 capsule by mouth daily.     clopidogrel (PLAVIX) 75 MG tablet Take 75 mg by mouth daily.     fenofibrate (TRICOR) 145 MG tablet Take 145 mg by mouth daily.     LORazepam (ATIVAN) 1 MG tablet Take 1 mg by mouth daily.     metoprolol succinate (TOPROL-XL) 25 MG 24 hr tablet Take 25 mg by mouth daily.     tadalafil (CIALIS) 5 MG tablet Take 5 mg by mouth daily as needed.     No current facility-administered medications for this visit.     REVIEW OF SYSTEMS:   Constitutional: Denies fevers, chills or night sweats Eyes: Denies blurriness of vision Ears, nose, mouth, throat, and face: Denies mucositis or sore throat Respiratory: Denies cough, dyspnea or wheezes Cardiovascular: Denies palpitation, chest discomfort or lower extremity swelling Gastrointestinal:  Denies nausea, heartburn or change in bowel habits Skin: Denies abnormal skin rashes Lymphatics: Denies new lymphadenopathy or easy bruising Neurological:Denies numbness, tingling or new weaknesses Behavioral/Psych: Mood is stable, no new changes  All other systems were reviewed with the patient and are negative.  PHYSICAL EXAMINATION: ECOG PERFORMANCE STATUS: 1 - Symptomatic but completely ambulatory  Vitals:   02/20/23 1115  BP: (!) 137/90  Pulse: 96  Resp: 18  Temp: 98.6 F (37 C)  SpO2: 97%   There were no vitals filed for this visit.  GENERAL:alert, no distress and comfortable  LABORATORY DATA:  I have reviewed the data as listed     Component Value Date/Time   NA  136 06/06/2014 1425   K 3.8 06/06/2014 1425   CL 103 06/06/2014 1425   CO2 26 06/06/2014 1425   GLUCOSE 134 (H) 06/06/2014 1425   BUN 13 06/06/2014 1425   CREATININE 1.02 06/06/2014 1425   CALCIUM 9.2 06/06/2014 1425   GFRNONAA 84 (L) 06/06/2014 1425   GFRAA >90 06/06/2014 1425    No results found for: "SPEP", "UPEP"  Lab Results  Component Value Date   WBC 4.0 02/05/2023   NEUTROABS 2.4 02/05/2023   HGB 14.5 02/05/2023   HCT 42.5 02/05/2023   MCV 90.0 02/05/2023   PLT 113 (L) 02/05/2023      Chemistry      Component Value Date/Time   NA 136 06/06/2014 1425   K 3.8 06/06/2014 1425   CL 103 06/06/2014 1425   CO2 26 06/06/2014 1425   BUN 13 06/06/2014 1425   CREATININE 1.02 06/06/2014 1425      Component Value Date/Time   CALCIUM 9.2 06/06/2014 1425

## 2023-02-20 NOTE — Assessment & Plan Note (Signed)
Skin rash and itching has been investigated for a long time It is not clear to me this is related to hemochromatosis

## 2023-02-20 NOTE — Telephone Encounter (Signed)
Called and moved appt to today at 1120, lab resulted. He is aware of appt.

## 2023-02-26 ENCOUNTER — Ambulatory Visit: Payer: BLUE CROSS/BLUE SHIELD | Admitting: Hematology and Oncology

## 2023-03-01 ENCOUNTER — Telehealth: Payer: Self-pay | Admitting: Hematology and Oncology

## 2023-03-01 NOTE — Telephone Encounter (Signed)
Per secure chat sent by Shawna Orleans on 10/17 left patient a message regarding changed appointment times

## 2023-03-05 ENCOUNTER — Inpatient Hospital Stay: Payer: Managed Care, Other (non HMO)

## 2023-03-05 ENCOUNTER — Other Ambulatory Visit: Payer: Self-pay

## 2023-03-05 LAB — CBC WITH DIFFERENTIAL/PLATELET
Abs Immature Granulocytes: 0.02 10*3/uL (ref 0.00–0.07)
Basophils Absolute: 0 10*3/uL (ref 0.0–0.1)
Basophils Relative: 1 %
Eosinophils Absolute: 0.1 10*3/uL (ref 0.0–0.5)
Eosinophils Relative: 2 %
HCT: 40.4 % (ref 39.0–52.0)
Hemoglobin: 13.7 g/dL (ref 13.0–17.0)
Immature Granulocytes: 0 %
Lymphocytes Relative: 17 %
Lymphs Abs: 0.8 10*3/uL (ref 0.7–4.0)
MCH: 32.7 pg (ref 26.0–34.0)
MCHC: 33.9 g/dL (ref 30.0–36.0)
MCV: 96.4 fL (ref 80.0–100.0)
Monocytes Absolute: 0.5 10*3/uL (ref 0.1–1.0)
Monocytes Relative: 9 %
Neutro Abs: 3.4 10*3/uL (ref 1.7–7.7)
Neutrophils Relative %: 71 %
Platelets: 108 10*3/uL — ABNORMAL LOW (ref 150–400)
RBC: 4.19 MIL/uL — ABNORMAL LOW (ref 4.22–5.81)
RDW: 16.7 % — ABNORMAL HIGH (ref 11.5–15.5)
WBC: 4.8 10*3/uL (ref 4.0–10.5)
nRBC: 0 % (ref 0.0–0.2)

## 2023-03-05 LAB — FERRITIN: Ferritin: 912 ng/mL — ABNORMAL HIGH (ref 24–336)

## 2023-03-05 NOTE — Progress Notes (Signed)
Edward Phillips presents today for phlebotomy per MD orders. Phlebotomy procedure started at 1455 and ended at 69. Phlebotomy Kit used. 512 grams removed.  Patient observed for 30 minutes after procedure without any incident. Patient tolerated procedure well. Food and fluids provided. IV needle removed intact. Vital signs stable, pt. left via ambulation and no respiratory distress noted.

## 2023-03-05 NOTE — Patient Instructions (Signed)

## 2023-03-19 ENCOUNTER — Inpatient Hospital Stay: Payer: Commercial Managed Care - HMO

## 2023-03-19 ENCOUNTER — Inpatient Hospital Stay: Payer: Commercial Managed Care - HMO | Attending: Hematology and Oncology

## 2023-03-19 DIAGNOSIS — Z79899 Other long term (current) drug therapy: Secondary | ICD-10-CM | POA: Diagnosis not present

## 2023-03-19 LAB — CBC WITH DIFFERENTIAL/PLATELET
Abs Immature Granulocytes: 0.01 10*3/uL (ref 0.00–0.07)
Basophils Absolute: 0.1 10*3/uL (ref 0.0–0.1)
Basophils Relative: 1 %
Eosinophils Absolute: 0.1 10*3/uL (ref 0.0–0.5)
Eosinophils Relative: 2 %
HCT: 47 % (ref 39.0–52.0)
Hemoglobin: 16 g/dL (ref 13.0–17.0)
Immature Granulocytes: 0 %
Lymphocytes Relative: 17 %
Lymphs Abs: 1 10*3/uL (ref 0.7–4.0)
MCH: 32.5 pg (ref 26.0–34.0)
MCHC: 34 g/dL (ref 30.0–36.0)
MCV: 95.3 fL (ref 80.0–100.0)
Monocytes Absolute: 0.6 10*3/uL (ref 0.1–1.0)
Monocytes Relative: 11 %
Neutro Abs: 3.9 10*3/uL (ref 1.7–7.7)
Neutrophils Relative %: 69 %
Platelets: 121 10*3/uL — ABNORMAL LOW (ref 150–400)
RBC: 4.93 MIL/uL (ref 4.22–5.81)
RDW: 15.2 % (ref 11.5–15.5)
WBC: 5.6 10*3/uL (ref 4.0–10.5)
nRBC: 0 % (ref 0.0–0.2)

## 2023-03-19 NOTE — Progress Notes (Signed)
Edward Phillips presents today for phlebotomy per MD orders. Phlebotomy procedure started at 1512 and ended at 1517, using 16G phlebotomy kit to L AC. 517 grams removed. Patient observed for 30 minutes after procedure without any incident. Beverage and snack provided. Patient tolerated procedure well. IV needle removed intact.

## 2023-03-19 NOTE — Patient Instructions (Signed)

## 2023-03-20 LAB — FERRITIN: Ferritin: 376 ng/mL — ABNORMAL HIGH (ref 24–336)

## 2023-04-02 ENCOUNTER — Inpatient Hospital Stay: Payer: Commercial Managed Care - HMO

## 2023-04-02 LAB — CBC WITH DIFFERENTIAL/PLATELET
Abs Immature Granulocytes: 0.02 10*3/uL (ref 0.00–0.07)
Basophils Absolute: 0.1 10*3/uL (ref 0.0–0.1)
Basophils Relative: 1 %
Eosinophils Absolute: 0.2 10*3/uL (ref 0.0–0.5)
Eosinophils Relative: 3 %
HCT: 47.4 % (ref 39.0–52.0)
Hemoglobin: 15.5 g/dL (ref 13.0–17.0)
Immature Granulocytes: 0 %
Lymphocytes Relative: 14 %
Lymphs Abs: 0.8 10*3/uL (ref 0.7–4.0)
MCH: 31.9 pg (ref 26.0–34.0)
MCHC: 32.7 g/dL (ref 30.0–36.0)
MCV: 97.5 fL (ref 80.0–100.0)
Monocytes Absolute: 0.4 10*3/uL (ref 0.1–1.0)
Monocytes Relative: 8 %
Neutro Abs: 3.9 10*3/uL (ref 1.7–7.7)
Neutrophils Relative %: 74 %
Platelets: 114 10*3/uL — ABNORMAL LOW (ref 150–400)
RBC: 4.86 MIL/uL (ref 4.22–5.81)
RDW: 14.3 % (ref 11.5–15.5)
WBC: 5.3 10*3/uL (ref 4.0–10.5)
nRBC: 0 % (ref 0.0–0.2)

## 2023-04-02 NOTE — Patient Instructions (Signed)

## 2023-04-02 NOTE — Progress Notes (Signed)
Edward Phillips presents today for phlebotomy per MD orders. Phlebotomy procedure started at 1508 and ended at 1512. 16G phlebotomy kit used to L cephalic vein. 506 cc removed. Patient tolerated procedure well. IV needle removed intact. Beverage and snack provided.

## 2023-04-13 ENCOUNTER — Encounter: Payer: Self-pay | Admitting: Hematology and Oncology

## 2023-04-16 ENCOUNTER — Inpatient Hospital Stay: Payer: Commercial Managed Care - HMO

## 2023-04-16 ENCOUNTER — Inpatient Hospital Stay: Payer: Commercial Managed Care - HMO | Attending: Hematology and Oncology

## 2023-04-16 LAB — CBC WITH DIFFERENTIAL/PLATELET
Abs Immature Granulocytes: 0.01 10*3/uL (ref 0.00–0.07)
Basophils Absolute: 0.1 10*3/uL (ref 0.0–0.1)
Basophils Relative: 1 %
Eosinophils Absolute: 0.1 10*3/uL (ref 0.0–0.5)
Eosinophils Relative: 2 %
HCT: 49.3 % (ref 39.0–52.0)
Hemoglobin: 16.7 g/dL (ref 13.0–17.0)
Immature Granulocytes: 0 %
Lymphocytes Relative: 16 %
Lymphs Abs: 0.8 10*3/uL (ref 0.7–4.0)
MCH: 32.4 pg (ref 26.0–34.0)
MCHC: 33.9 g/dL (ref 30.0–36.0)
MCV: 95.5 fL (ref 80.0–100.0)
Monocytes Absolute: 0.6 10*3/uL (ref 0.1–1.0)
Monocytes Relative: 11 %
Neutro Abs: 3.6 10*3/uL (ref 1.7–7.7)
Neutrophils Relative %: 70 %
Platelets: 122 10*3/uL — ABNORMAL LOW (ref 150–400)
RBC: 5.16 MIL/uL (ref 4.22–5.81)
RDW: 13.1 % (ref 11.5–15.5)
WBC: 5.1 10*3/uL (ref 4.0–10.5)
nRBC: 0 % (ref 0.0–0.2)

## 2023-04-16 NOTE — Progress Notes (Signed)
Edward Phillips presents today for phlebotomy per MD orders. Phlebotomy procedure started at 1509 and ended at 1519. 16G phlebotomy kit used in LAC.  515 grams removed. Patient observed for 30 minutes after procedure without any incident. Patient tolerated procedure well. IV needle removed intact.

## 2023-04-16 NOTE — Patient Instructions (Signed)

## 2023-04-17 ENCOUNTER — Telehealth: Payer: Self-pay

## 2023-04-17 LAB — FERRITIN: Ferritin: 74 ng/mL (ref 24–336)

## 2023-04-17 NOTE — Telephone Encounter (Signed)
-----   Message from Artis Delay sent at 04/17/2023 10:57 AM EST ----- Hi,  Can you call him? His ferritin is now normal Based on this, I recommend canceling his appt in Dec and Jan and see him in Feb as scheduled I would also move his labs in Feb to be done ahead of time so we have the results If he agrees, let me know

## 2023-04-17 NOTE — Telephone Encounter (Signed)
Called and given below message. He verbalized understanding and agrees with recommendations.

## 2023-04-30 ENCOUNTER — Inpatient Hospital Stay: Payer: Commercial Managed Care - HMO

## 2023-05-01 ENCOUNTER — Other Ambulatory Visit: Payer: Self-pay | Admitting: Surgery

## 2023-05-14 ENCOUNTER — Other Ambulatory Visit: Payer: Managed Care, Other (non HMO)

## 2023-05-16 DIAGNOSIS — L719 Rosacea, unspecified: Secondary | ICD-10-CM

## 2023-05-16 HISTORY — DX: Rosacea, unspecified: L71.9

## 2023-05-28 ENCOUNTER — Other Ambulatory Visit: Payer: Managed Care, Other (non HMO)

## 2023-06-11 ENCOUNTER — Other Ambulatory Visit: Payer: Managed Care, Other (non HMO)

## 2023-06-22 ENCOUNTER — Inpatient Hospital Stay: Payer: Commercial Managed Care - HMO | Attending: Hematology and Oncology

## 2023-06-22 DIAGNOSIS — Z79899 Other long term (current) drug therapy: Secondary | ICD-10-CM | POA: Diagnosis not present

## 2023-06-22 DIAGNOSIS — R21 Rash and other nonspecific skin eruption: Secondary | ICD-10-CM | POA: Insufficient documentation

## 2023-06-22 DIAGNOSIS — Z7902 Long term (current) use of antithrombotics/antiplatelets: Secondary | ICD-10-CM | POA: Diagnosis not present

## 2023-06-22 DIAGNOSIS — D696 Thrombocytopenia, unspecified: Secondary | ICD-10-CM | POA: Diagnosis not present

## 2023-06-22 DIAGNOSIS — Z803 Family history of malignant neoplasm of breast: Secondary | ICD-10-CM | POA: Insufficient documentation

## 2023-06-22 LAB — CBC WITH DIFFERENTIAL/PLATELET
Abs Immature Granulocytes: 0.01 10*3/uL (ref 0.00–0.07)
Basophils Absolute: 0 10*3/uL (ref 0.0–0.1)
Basophils Relative: 1 %
Eosinophils Absolute: 0.1 10*3/uL (ref 0.0–0.5)
Eosinophils Relative: 2 %
HCT: 47.2 % (ref 39.0–52.0)
Hemoglobin: 16 g/dL (ref 13.0–17.0)
Immature Granulocytes: 0 %
Lymphocytes Relative: 19 %
Lymphs Abs: 0.7 10*3/uL (ref 0.7–4.0)
MCH: 30.4 pg (ref 26.0–34.0)
MCHC: 33.9 g/dL (ref 30.0–36.0)
MCV: 89.6 fL (ref 80.0–100.0)
Monocytes Absolute: 0.5 10*3/uL (ref 0.1–1.0)
Monocytes Relative: 15 %
Neutro Abs: 2.4 10*3/uL (ref 1.7–7.7)
Neutrophils Relative %: 63 %
Platelets: 86 10*3/uL — ABNORMAL LOW (ref 150–400)
RBC: 5.27 MIL/uL (ref 4.22–5.81)
RDW: 14.4 % (ref 11.5–15.5)
WBC: 3.7 10*3/uL — ABNORMAL LOW (ref 4.0–10.5)
nRBC: 0 % (ref 0.0–0.2)

## 2023-06-22 LAB — FERRITIN: Ferritin: 101 ng/mL (ref 24–336)

## 2023-06-25 ENCOUNTER — Other Ambulatory Visit: Payer: Managed Care, Other (non HMO)

## 2023-06-29 ENCOUNTER — Inpatient Hospital Stay: Payer: Commercial Managed Care - HMO

## 2023-06-29 ENCOUNTER — Other Ambulatory Visit: Payer: Managed Care, Other (non HMO)

## 2023-06-29 ENCOUNTER — Inpatient Hospital Stay: Payer: Commercial Managed Care - HMO | Admitting: Hematology and Oncology

## 2023-06-29 ENCOUNTER — Encounter: Payer: Self-pay | Admitting: Hematology and Oncology

## 2023-06-29 DIAGNOSIS — R21 Rash and other nonspecific skin eruption: Secondary | ICD-10-CM | POA: Diagnosis not present

## 2023-06-29 DIAGNOSIS — D696 Thrombocytopenia, unspecified: Secondary | ICD-10-CM | POA: Diagnosis not present

## 2023-06-29 NOTE — Assessment & Plan Note (Signed)
Skin rash and itching has been investigated for a long time The patient told me he might be diagnosed with lupus I will defer to his rheumatologist for management

## 2023-06-29 NOTE — Assessment & Plan Note (Signed)
After several sessions of phlebotomy, his ferritin is staying low He does not need phlebotomy again unless his ferritin is over 250 He has a lot of appointment with other physicians I recommend repeat labs again in 6 to 9 months He will reach out to me if we need phlebotomy in the future

## 2023-06-29 NOTE — Progress Notes (Signed)
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   Skin rash Skin rash and itching has been investigated for a long time The patient told me he might be diagnosed with lupus I will defer to his rheumatologist for management  No orders of the defined types were placed in this encounter.   The total time spent in the appointment was 20 minutes encounter with patients including review of chart and various tests results, discussions about plan of care and coordination of care plan   All questions were answered. The patient knows to call the clinic with any problems, questions or concerns. No barriers to learning was detected.    Artis Delay, MD 2/14/20253:51 PM  INTERVAL HISTORY: Edward Phillips 58 y.o. male returns for further follow-up for chronic thrombocytopenia and iron overload He tolerated phlebotomy well We discussed test results and future follow-up He still have persistent skin rash According to the patient, he had repeat biopsy and the cause of the rash was thought to be autoimmune related, could be lupus  SUMMARY OF HEMATOLOGIC HISTORY:  Edward Phillips 58 y.o. male is here because of high ferritin, thrombocytopenia and skin itching The patient is here accompanied by his wife The patient is in good health until around July of this year when he was surveying lands to look for a site to build his home He has good  memory recall Soon after his visit to a plot of land on December 01, 2022, he developed significant skin itching all over his body.  Initially, he felt that it was triggered by bug bites but soon after, it got worse and nonresolving.  He was prescribed doxycycline and took it for several weeks for possible exposure to tick bites but that did not help.  He also placed on multiple courses of corticosteroids and steroid injection without resolution of his skin itchiness. Ultimately, he had extensive blood work and was noted to have mild thrombocytopenia, thought to be related to platelet clumping per outside labs as well as very high ferritin level.  His ANA came back abnormal and he was referred to see rheumatologist  He was subsequently referred here for further evaluation for thrombocytopenia and high ferritin level  The patient had donated a lot of blood in the past but none since he graduated from college.  There is no family history to suggest of inheritable iron overload syndrome such as hemochromatosis.  His mother died from heart disease/diabetes related complication at the age of 7.  His father had melanoma and subsequently died around the age of 23 and also suffer from dementia.  He has 1 sister with breast cancer diagnosed around the age of 52. He denies recent bruising/bleeding, such as spontaneous epistaxis, hematuria, melena or hematochezia The patient denies history of liver disease, although on review of his blood work, the patient had borderline chronic elevated liver enzymes for many years On review of his electronic records  dated back to 2021, he had very high ferritin level In June 2021, his ferritin was 1600 On January 01, 2023, his ferritin was 1727 with normal iron saturation and total iron binding capacity PCR test reviewed abnormal mutation consistent with hemochromatosis Between 20 24-20 25, he has several sessions of phlebotomy  I have reviewed the past medical history, past  surgical history, social history and family history with the patient and they are unchanged from previous note.  ALLERGIES:  is allergic to penicillins.  MEDICATIONS:  Current Outpatient Medications  Medication Sig Dispense Refill   atorvastatin (LIPITOR) 40 MG tablet Take 40 mg by mouth daily.     Cholecalciferol (VITAMIN D) 50 MCG (2000 UT) CAPS Take 1 capsule by mouth daily.     clopidogrel (PLAVIX) 75 MG tablet Take 75 mg by mouth daily.     fenofibrate (TRICOR) 145 MG tablet Take 145 mg by mouth daily.     LORazepam (ATIVAN) 1 MG tablet Take 1 mg by mouth daily.     metoprolol succinate (TOPROL-XL) 25 MG 24 hr tablet Take 25 mg by mouth daily.     tadalafil (CIALIS) 5 MG tablet Take 5 mg by mouth daily as needed.     No current facility-administered medications for this visit.     REVIEW OF SYSTEMS:   Constitutional: Denies fevers, chills or night sweats Eyes: Denies blurriness of vision Ears, nose, mouth, throat, and face: Denies mucositis or sore throat Respiratory: Denies cough, dyspnea or wheezes Cardiovascular: Denies palpitation, chest discomfort or lower extremity swelling Gastrointestinal:  Denies nausea, heartburn or change in bowel habits Skin: Denies abnormal skin rashes Lymphatics: Denies new lymphadenopathy or easy bruising Neurological:Denies numbness, tingling or new weaknesses Behavioral/Psych: Mood is stable, no new changes  All other systems were reviewed with the patient and are negative.  PHYSICAL EXAMINATION: ECOG PERFORMANCE STATUS: 1 - Symptomatic but completely ambulatory  Vitals:   06/29/23 1358  BP: 131/82  Pulse: 94  Resp: 18  Temp: (!) 97.4 F (36.3 C)  SpO2: 97%   Filed Weights   06/29/23 1358  Weight: 222 lb 12.8 oz (101.1 kg)    GENERAL:alert, no distress and comfortable  LABORATORY DATA:  I have reviewed the data as listed     Component Value Date/Time   NA 136 06/06/2014 1425   K 3.8 06/06/2014 1425   CL 103 06/06/2014  1425   CO2 26 06/06/2014 1425   GLUCOSE 134 (H) 06/06/2014 1425   BUN 13 06/06/2014 1425   CREATININE 1.02 06/06/2014 1425   CALCIUM 9.2 06/06/2014 1425   GFRNONAA 84 (L) 06/06/2014 1425   GFRAA >90 06/06/2014 1425    No results found for: "SPEP", "UPEP"  Lab Results  Component Value Date   WBC 3.7 (L) 06/22/2023   NEUTROABS 2.4 06/22/2023   HGB 16.0 06/22/2023   HCT 47.2 06/22/2023   MCV 89.6 06/22/2023   PLT 86 (L) 06/22/2023      Chemistry      Component Value Date/Time   NA 136 06/06/2014 1425   K 3.8 06/06/2014 1425   CL 103 06/06/2014 1425   CO2 26 06/06/2014 1425   BUN 13 06/06/2014 1425   CREATININE 1.02 06/06/2014 1425      Component Value Date/Time   CALCIUM 9.2 06/06/2014 1425

## 2023-06-29 NOTE — Assessment & Plan Note (Signed)
Likely related to his liver disease Will monitor closely There is no contraindication to remain on antiplatelet agents or anticoagulants as long as the platelet is greater than 50,000.

## 2023-07-09 ENCOUNTER — Inpatient Hospital Stay: Payer: Commercial Managed Care - HMO

## 2023-07-11 ENCOUNTER — Encounter (HOSPITAL_COMMUNITY): Payer: Self-pay | Admitting: Certified Registered Nurse Anesthetist

## 2023-07-11 NOTE — H&P (Signed)
 REFERRING PHYSICIAN: Marshia Ly, PA PROVIDER: Wayne Both, MD MRN: [id number] DOB: 1966/05/03  Subjective   Chief Complaint: New Consultation  History of Present Illness: Edward Phillips is a 58 y.o. male who is seen today as an office consultation for evaluation of New Consultation  This gentleman is referred here for evaluation of an umbilical hernia. He has a history of coronary artery disease status post bypass 4 years ago as well as recent stents in February of this year. He is on Plavix. He also has hemochromatosis and has been getting frequent blood draws to bring his ferritin level back to normal. He has had the hernia greater than 10 years. It causes focal discomfort but no obstructive symptoms. He is otherwise without complaints  Review of Systems: A complete review of systems was obtained from the patient. I have reviewed this information and discussed as appropriate with the patient. See HPI as well for other ROS.  ROS   Medical History: Past Medical History:  Diagnosis Date  Arthritis  Hyperlipidemia  Hypertension   There is no problem list on file for this patient.  History reviewed. No pertinent surgical history.   Allergies  Allergen Reactions  Penicillins Itching and Other (See Comments)   Current Outpatient Medications on File Prior to Visit  Medication Sig Dispense Refill  atorvastatin (LIPITOR) 40 MG tablet Take 40 mg by mouth once daily  clopidogreL (PLAVIX) 75 mg tablet Take 75 mg by mouth once daily  FLUoxetine (PROZAC) 40 MG capsule Take 40 mg by mouth once daily  LORazepam (ATIVAN) 1 MG tablet Take 1 mg by mouth once daily  metoprolol SUCCinate (TOPROL-XL) 25 MG XL tablet TAKE THREE TABLETS BY MOUTH IN THE MORNING  REPATHA SURECLICK 140 mg/mL PnIj Inject 140 mg subcutaneously  solifenacin (VESICARE) 5 MG tablet Take 5 mg by mouth once daily  tadalafiL (CIALIS) 5 MG tablet Take 5 mg by mouth at bedtime as needed   No current  facility-administered medications on file prior to visit.   Family History  Problem Relation Age of Onset  High blood pressure (Hypertension) Mother  Hyperlipidemia (Elevated cholesterol) Mother  Coronary Artery Disease (Blocked arteries around heart) Mother  Diabetes Mother  Skin cancer Father  Hyperlipidemia (Elevated cholesterol) Father  Breast cancer Sister    Social History   Tobacco Use  Smoking Status Never  Smokeless Tobacco Never    Social History   Socioeconomic History  Marital status: Married  Tobacco Use  Smoking status: Never  Smokeless tobacco: Never  Substance and Sexual Activity  Alcohol use: Yes  Drug use: Never   Social Drivers of Corporate investment banker Strain: Low Risk (08/15/2022)  Received from [facility]  Overall Financial Resource Strain (CARDIA)  Difficulty of Paying Living Expenses: Not hard at all  Food Insecurity: No Food Insecurity (08/15/2022)  Received from [facility]  Hunger Vital Sign  Worried About Running Out of Food in the Last Year: Never true  Ran Out of Food in the Last Year: Never true  Transportation Needs: No Transportation Needs (08/15/2022)  Received [facility] - Transportation  Lack of Transportation (Medical): No  Lack of Transportation (Non-Medical): No  Physical Activity: Insufficiently Active (08/15/2022)  Received from [facility]  Exercise Vital Sign  Days of Exercise per Week: 3 days  Minutes of Exercise per Session: 30 min  Stress: No Stress Concern Present (08/15/2022)  Received from [facility] - Occupational Stress  Questionnaire  Feeling of Stress : Not at all  Social Connections: Socially Integrated (08/15/2022)  Received from [facility]  Social Network  How would you rate your social network (family, work, friends)?: Good participation with social networks  Housing Stability: Low Risk (08/15/2022)  Received from [facility]  Stability Vital Sign  Unable to Pay for Housing in the Last Year: No  Number of Places Lived in the Last Year: 1  Unstable Housing in the Last Year: No   Objective:   Vitals:   BP: (!) 140/80  Pulse: 71  Temp: 36.6 C (97.8 F)  SpO2: 97%  Weight: (!) 100.8 kg (222 lb 3.2 oz)  Height: 177.8 cm (5\' 10" )  PainSc: 0-No pain  PainLoc: Abdomen   Body mass index is 31.88 kg/m.  Physical Exam   He appears well on exam  There is a small, visible hernia above his umbilicus. It is chronically incarcerated with that and is mildly tender. There is no erythema. The fascial defect is approximately 1 cm  Labs, Imaging and Diagnostic Testing: I have reviewed his notes in the electronic medical records  Assessment and Plan:   Diagnoses and all orders for this visit:  Incarcerated umbilical hernia   I discussed abdominal anatomy and hernias with him. As he is becoming increasingly symptomatic at the umbilical hernia he would like it repaired which I believe is reasonable. We did discuss continued conservative management. As far surgery goes I discussed proceeding with an open umbilical hernia repair with possible mesh. I explained the surgical procedure in detail. I discussed the risks which includes but is not limited to bleeding, infection, use of mesh, hernia recurrence, cardiopulmonary issues, postop recovery, etc. I also explained the laparoscopic procedure but he would rather undergo an open repair as an outpatient.

## 2023-07-11 NOTE — Progress Notes (Signed)
 Per patient he is going to cancel procedure for tomorrow message left for Dr. Eliberto Ivory scheduler

## 2023-07-12 ENCOUNTER — Encounter (HOSPITAL_COMMUNITY): Payer: Self-pay | Admitting: Certified Registered Nurse Anesthetist

## 2023-07-12 ENCOUNTER — Ambulatory Visit (HOSPITAL_COMMUNITY): Admission: RE | Admit: 2023-07-12 | Payer: Commercial Managed Care - HMO | Source: Home / Self Care | Admitting: Surgery

## 2023-07-12 ENCOUNTER — Encounter (HOSPITAL_COMMUNITY): Admission: RE | Payer: Self-pay | Source: Home / Self Care

## 2023-07-12 SURGERY — HERNIA REPAIR UMBILICAL ADULT
Anesthesia: General

## 2023-11-18 ENCOUNTER — Encounter: Payer: Self-pay | Admitting: Hematology and Oncology

## 2023-11-19 ENCOUNTER — Other Ambulatory Visit: Payer: Self-pay | Admitting: *Deleted

## 2023-11-19 ENCOUNTER — Encounter: Payer: Self-pay | Admitting: Hematology and Oncology

## 2023-11-19 ENCOUNTER — Telehealth: Payer: Self-pay | Admitting: *Deleted

## 2023-11-19 DIAGNOSIS — I10 Essential (primary) hypertension: Secondary | ICD-10-CM

## 2023-11-19 DIAGNOSIS — D696 Thrombocytopenia, unspecified: Secondary | ICD-10-CM

## 2023-11-19 NOTE — Telephone Encounter (Signed)
 Pt sent a My Chart as well as a phone call requesting to obtain an appt for lab due to feeling like when my levels were elevated .  This RN attempted to call pt to inquire further without success.  Appt made for lab for tomorrow at 2pm- informed on identified VM as well as in response to his my chart message.  Labs entered for CBC Ferritin and extra clot.  This note will be forwarded to MD for review and any further recommendations if needed,

## 2023-11-20 ENCOUNTER — Inpatient Hospital Stay: Payer: Self-pay | Attending: Hematology and Oncology

## 2023-11-20 LAB — CBC WITH DIFFERENTIAL/PLATELET
Abs Immature Granulocytes: 0.01 K/uL (ref 0.00–0.07)
Basophils Absolute: 0 K/uL (ref 0.0–0.1)
Basophils Relative: 1 %
Eosinophils Absolute: 0.1 K/uL (ref 0.0–0.5)
Eosinophils Relative: 3 %
HCT: 43.8 % (ref 39.0–52.0)
Hemoglobin: 14.9 g/dL (ref 13.0–17.0)
Immature Granulocytes: 0 %
Lymphocytes Relative: 17 %
Lymphs Abs: 0.5 K/uL — ABNORMAL LOW (ref 0.7–4.0)
MCH: 31.3 pg (ref 26.0–34.0)
MCHC: 34 g/dL (ref 30.0–36.0)
MCV: 92 fL (ref 80.0–100.0)
Monocytes Absolute: 0.5 K/uL (ref 0.1–1.0)
Monocytes Relative: 15 %
Neutro Abs: 2 K/uL (ref 1.7–7.7)
Neutrophils Relative %: 64 %
Platelets: 88 K/uL — ABNORMAL LOW (ref 150–400)
RBC: 4.76 MIL/uL (ref 4.22–5.81)
RDW: 13.6 % (ref 11.5–15.5)
WBC: 3.2 K/uL — ABNORMAL LOW (ref 4.0–10.5)
nRBC: 0 % (ref 0.0–0.2)

## 2023-11-20 LAB — FERRITIN: Ferritin: 127 ng/mL (ref 24–336)

## 2023-11-20 NOTE — Telephone Encounter (Signed)
 Hi Leita,  Let him know I will be watching for his lab results, will call him tomorrow to see if he needs phlebotomy

## 2023-11-20 NOTE — Telephone Encounter (Signed)
 Attempted to call pt, and his wife, Edward Phillips to make him aware of lab appt and that you will call him with results. No answer. I left a VM for him and for his wife to make him aware and asked for him to call me back to confirm he received our message.

## 2023-11-21 ENCOUNTER — Encounter: Payer: Self-pay | Admitting: Hematology and Oncology

## 2023-11-21 ENCOUNTER — Ambulatory Visit: Payer: Self-pay | Admitting: Hematology and Oncology

## 2023-11-21 NOTE — Telephone Encounter (Signed)
-----   Message from Almarie Bedford sent at 11/21/2023  9:57 AM EDT ----- Pls call patient, Ferritin is not high Plan to only do phlebotomy for ferritin over 250 ----- Message ----- From: Interface, Lab In [place] Sent: 11/20/2023   3:00 PM EDT To: Almarie Bedford, MD

## 2023-11-21 NOTE — Telephone Encounter (Signed)
 Contacted patient with Dr. Buzz message below about his Ferritin. Mr. Edward Phillips verbalized understanding and stated he'd donated blood a few days earlier and thought that probably affected Ferritin level. He states he will continue to donate blood every couple of months.

## 2024-01-11 ENCOUNTER — Telehealth: Payer: Self-pay

## 2024-01-11 ENCOUNTER — Other Ambulatory Visit: Payer: Self-pay | Admitting: Internal Medicine

## 2024-01-11 DIAGNOSIS — D696 Thrombocytopenia, unspecified: Secondary | ICD-10-CM

## 2024-01-11 NOTE — Telephone Encounter (Signed)
 Kayla from [facility] called the office to see if patient had been scheduled yet , advised patient was scheduled in January with Dr. Dolphus. She was asking if he could be seen sooner advised that was the soonest Dr. Dolphus had at the time. Mentioned possibly seeing Dr. Luba if it was possible but Ileana said she would call the office back after speaking with the referring provider.

## 2024-02-08 ENCOUNTER — Encounter: Payer: Self-pay | Admitting: Rheumatology

## 2024-02-15 ENCOUNTER — Other Ambulatory Visit: Payer: Self-pay | Admitting: Internal Medicine

## 2024-02-15 DIAGNOSIS — D696 Thrombocytopenia, unspecified: Secondary | ICD-10-CM

## 2024-02-27 DIAGNOSIS — N400 Enlarged prostate without lower urinary tract symptoms: Secondary | ICD-10-CM | POA: Insufficient documentation

## 2024-02-27 DIAGNOSIS — D72819 Decreased white blood cell count, unspecified: Secondary | ICD-10-CM | POA: Insufficient documentation

## 2024-02-27 DIAGNOSIS — F32A Depression, unspecified: Secondary | ICD-10-CM | POA: Insufficient documentation

## 2024-02-27 DIAGNOSIS — I25708 Atherosclerosis of coronary artery bypass graft(s), unspecified, with other forms of angina pectoris: Secondary | ICD-10-CM | POA: Insufficient documentation

## 2024-02-27 DIAGNOSIS — L932 Other local lupus erythematosus: Secondary | ICD-10-CM | POA: Insufficient documentation

## 2024-02-27 NOTE — Progress Notes (Signed)
 Office Visit Note  Patient: Edward Phillips             Date of Birth: 1965/10/22           MRN: [id number]             PCP: Roanna Ezekiel NOVAK, MD Referring: Roanna Ezekiel NOVAK, MD Visit Date: 03/12/2024 Occupation: Data Unavailable  Subjective:  Rash,positive ANA  History of Present Illness: Edward Phillips is a 58 y.o. 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  According the patient his symptoms started in July 2024 while he developed some chigger bites.  This was a second episode of developing chigger bites.  He was seen by his PCP and was given doxycycline for possible Lyme disease.  He finished a course of doxycycline.  After that in August 2024 he went to [place]  on his work trip.  He states he was under a lot of stress and developed rash all over his body.  He was also experiencing fatigue.  He was referred to hematology because of high ferritin.  He was diagnosed with hemochromatosis and had phlebotomies which helped.  As his ANA remain positive he was referred to Dr. Ishmael at [facility] rheumatology who found positive ANA but did not make the diagnosis of lupus.  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  At the time he was started on Plaquenil 200 mg a day.  He has been taking Plaquenil since then.  He also uses topical TMC which helps.  His respirations persist.  He has not noticed any improvement in the fatigue or photosensitivity.  He also gives history of sensitivity of his mouth.  He has difficulty eating due to increase sensitivity of the gums and mouth.  He has not noticed any bleeding in his gums.  He was given a diagnosis of possible oral candidiasis by his previous dermatologist but was advised by the second dermatologist that he did not have oral candidiasis.  He made his own Magic mouthwash and has been using it for the last 2 days which  has been helping to some extent.  He states he played several sports when he was young and he has had shoulder and knee joint discomfort off and on.  He denies any history of joint swelling.  He also gives history of L5 vertebral fracture and bulging disc.  He is seen back specialist.  He also goes for acupuncture.  There is no history of oral ulcers, sicca symptoms, Raynaud's phenomenon, lymphadenopathy or inflammatory arthritis. 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  He enjoys playing golf.  He walks for exercise.  He is married.  He is accompanied by his wife Sonny today.  He has 3 healthy children.  There is no family history of autoimmune disease.  He drinks alcohol rarely and has never been a smoker.  Medrol Dosepak December 18, 2023  Activities of Daily Living:  Patient reports morning stiffness for 30-60 minutes.   Patient Reports nocturnal pain.  Difficulty dressing/grooming: Reports Difficulty climbing stairs: Denies Difficulty getting out of chair: Denies Difficulty using hands for taps, buttons, cutlery, and/or writing: Denies  Review of Systems  Constitutional:  Positive for fatigue.  HENT:  Positive for mouth sores. Negative for mouth dryness.   Eyes:  Negative for dryness.  Respiratory:  Negative  for shortness of breath.   Cardiovascular:  Negative for chest pain and palpitations.  Gastrointestinal:  Positive for diarrhea. Negative for blood in stool and constipation.  Endocrine: Positive for increased urination.  Genitourinary:  Positive for urgency. Negative for involuntary urination.  Musculoskeletal:  Positive for joint pain, joint pain and morning stiffness. Negative for gait problem, joint swelling, myalgias, muscle weakness, muscle tenderness and myalgias.  Skin:  Positive for rash and sensitivity to sunlight. Negative for color change and hair loss.  Allergic/Immunologic: Negative for susceptible to infections.   Neurological:  Positive for headaches. Negative for dizziness.  Hematological:  Negative for swollen glands.  Psychiatric/Behavioral:  Positive for sleep disturbance. Negative for depressed mood. The patient is nervous/anxious.     PMFS History:  Patient Active Problem List   Diagnosis Date Noted   Cutaneous lupus erythematosus 02/27/2024   Leucopenia 02/27/2024   Atherosclerosis of coronary artery bypass graft of native heart with stable angina pectoris 02/27/2024   Anxiety and depression 02/27/2024   Prostate enlargement 02/27/2024   Hemochromatosis 02/05/2023   Skin rash 02/05/2023   Thrombocytopenia 02/05/2023   Abnormal liver enzymes 02/05/2023   HYPERLIPIDEMIA 10/13/2008   Essential hypertension 10/13/2008    Past Medical History:  Diagnosis Date   Hyperlipidemia    Hypertension    Osteoarthritis    Testosterone deficiency in male    Umbilical hernia     Family History  Problem Relation Age of Onset   Diabetes Mother    Heart disease Mother    Cancer Father        melanoma   Dementia Father    Cancer Sister        breast ca   Healthy Brother    Healthy Son    Healthy Daughter    Healthy Daughter    Past Surgical History:  Procedure Laterality Date   CARDIAC CATHETERIZATION  2000?   CORONARY ARTERY BYPASS GRAFT     coronary stent     NASAL SEPTUM SURGERY  05/15/1981   WRIST SURGERY Left 05/15/1985   tendon surgery    Social History   Tobacco Use   Smoking status: Never    Passive exposure: Never   Smokeless tobacco: Never  Vaping Use   Vaping status: Never Used  Substance Use Topics   Alcohol use: Yes    Comment: rarely   Drug use: No   Social History   Social History Narrative   Not on file      There is no immunization history on file for this patient.   Objective: Vital Signs: BP 137/77 (BP Location: Right Arm, Patient Position: Sitting, Cuff Size: Normal)   Pulse 74   Temp 98.6 F (37 C)   Resp 16   Ht 5' 10 (1.778 m)   Wt  199 lb (90.3 kg)   BMI 28.55 kg/m    Physical Exam Vitals and nursing note reviewed.  Constitutional:      Appearance: He is well-developed.  HENT:     Head: Normocephalic and atraumatic.  Eyes:     Conjunctiva/sclera: Conjunctivae normal.     Pupils: Pupils are equal, round, and reactive to light.  Cardiovascular:     Rate and Rhythm: Normal rate and regular rhythm.     Heart sounds: Normal heart sounds.  Pulmonary:     Effort: Pulmonary effort is normal.     Breath sounds: Normal breath sounds.  Abdominal:     General: Bowel sounds are normal.  Palpations: Abdomen is soft.  Musculoskeletal:     Cervical back: Normal range of motion and neck supple.  Skin:    General: Skin is warm and dry.     Capillary Refill: Capillary refill takes less than 2 seconds.     Findings: Rash present.     Comments: He had good capillary refill without any nailbed capillary changes.  No sclerodactyly was noted.  No Telengectesia were noted.  Neurological:     Mental Status: He is alert and oriented to person, place, and time.  Psychiatric:        Behavior: Behavior normal.           Musculoskeletal Exam: He had limited lateral rotation flexion and extension of the cervical spine with some discomfort.  Thoracic and lumbar spine were in good range of motion with discomfort over the lower lumbar region.  Shoulders were in good range of motion with some discomfort.  Elbow joints, wrist joints, MCPs PIPs and DIPs were in good range of motion.  He had some tenderness over bilateral CMC joints.  Hip joints and knee joints in good range of motion without any warmth swelling or effusion.  Bilateral dorsal spurs were noted.  There was no tenderness over MTPs or PIPs.  CDAI Exam: CDAI Score: -- Patient Global: --; Provider Global: -- Swollen: --; Tender: -- Joint Exam 03/12/2024   No joint exam has been documented for this visit   There is currently no information documented on the homunculus.  Go to the Rheumatology activity and complete the homunculus joint exam.  Investigation: No additional findings.  Imaging: No results found.  Recent Labs: Lab Results  Component Value Date   WBC 3.2 (L) 11/20/2023   HGB 14.9 11/20/2023   PLT 88 (L) 11/20/2023   NA 136 06/06/2014   K 3.8 06/06/2014   CL 103 06/06/2014   CO2 26 06/06/2014   GLUCOSE 134 (H) 06/06/2014   BUN 13 06/06/2014   CREATININE 1.02 06/06/2014   CALCIUM 9.2 06/06/2014   GFRAA >90 06/06/2014   December 18, 2023 HDL 28, triglycerides 181, CBC WBC 3.0, hemoglobin 16.5, hematocrit 51.8, platelets 98,  TSH normal, CMP creatinine 0.82, AST 35, ALT 40 hemoglobin A1c 6.6, B12 328, folate 17, PSA elevated, ANA 1: 1280 homogeneous,, ENA (double-stranded DNA, chromatin, Smith, RNP, SSA, SSB, SCL 70, Jo 1, centromere, (negative, C3-C4 normal, anticardiolipin IgG and IgM negative, beta-2 GP 1 IgG and IgM negative, rheumatoid factor<5, anti-CCP<16, TPO antibody<1.0, HIV negative, hepatitis B and hepatitis C nonreactive  Speciality Comments: No specialty comments available.  Procedures:  No procedures performed Allergies: Penicillins   Assessment / Plan:     Visit Diagnoses: Cutaneous lupus erythematosus -he developed a rash in August 2024 on his lower extremities.  He was initially treated for possible Lyme disease with doxycycline.  He developed rash on his trunk in February 2025.  He has had persistent rash since then.    He states the biopsy was positive for cutaneous lupus and he was placed on Plaquenil 200 mg p.o. daily by his dermatologist which has not been effective.  He states triamcinolone topical treatment has been effective to some extent.  He has been using sunscreen on a regular basis.  I will send him for a second opinion to [facility] for the diagnosis.  Plan: Ambulatory referral to Dermatology, Sedimentation rate, ANA, Anti-DNA antibody, double-stranded, Lupus Anticoagulant Eval w/Reflex, C3 and  C4  High risk medication use - Plaquenil 200 mg  p.o.  daily.  He has been on an adequate dose of hydroxychloroquine.  I will increase the dose to hydroxychloroquine 200 mg p.o. twice daily.  In anticipation to use more aggressive treatment in the future future, I will obtain additional labs.  He was also advised baseline eye examination and annual eye examination.  I will refer him to ophthalmology.  Information minimization was placed in the AVS.  Plan: Ambulatory referral to Ophthalmology, CBC with Differential/Platelet, Comprehensive metabolic panel with GFR, Glucose 6 phosphate dehydrogenase, QuantiFERON-TB Gold Plus, Serum protein electrophoresis with reflex, IgG, IgA, IgM, Thiopurine methyltransferase(tpmt)rbc  Patient was counseled on the purpose, proper use, and adverse effects of hydroxychloroquine including nausea/diarrhea, skin rash, headaches, and sun sensitivity.  Advised patient to wear sunscreen once starting hydroxychloroquine to reduce risk of rash associated with sun sensitivity.  Discussed importance of annual eye exams while on hydroxychloroquine to monitor to ocular toxicity and discussed importance of frequent laboratory monitoring.  Provided patient with eye exam form for baseline ophthalmologic exam.  Provided patient with educational materials on hydroxychloroquine and answered all questions.  Patient consented to hydroxychloroquine. Will upload consent in the media tab.    Reviewed risk for QTC prolongation when used in combination with other QTc prolonging agents (including but not limited to antiarrhythmics, macrolide antibiotics, flouroquinolone antibiotics, haloperidol, quetiapine, olanzapine, risperidone, droperidol, ziprasidone, amitriptyline, citalopram, ondansetron , migraine triptans, and methadone).  Positive ANA (antinuclear antibody)-he has positive ANA high titer, ENA panel negative and complements normal.  There is history of fatigue, oral sensitivity, rash and  photosensitivity.  He also has neutropenia and thrombocytopenia.  Possibility of systemic lupus was discussed.  There is no history of sicca symptoms, Raynaud's phenomenon, inflammatory arthritis or lymphadenopathy.  He had good capillary refill with no nailbed capillary changes.  No synovitis was noted on the examination.  I will increase the dose of Plaquenil to 200 mg p.o. twice daily.  Other decreased white blood cell (WBC) count-he has had low white cell count since February 2025.  I also sent a message to Dr. Lonn if any further workup is needed regarding leukopenia and thrombocytopenia.  Thrombocytopenia-thrombocytopenia has been persistent for the last couple of years.  DDD (degenerative disc disease), cervical-he gives history of discomfort in the cervical spine and has been diagnosed with this disease in the past.  He states he has seen a spinal specialist.  Lumbar spondylosis-he has been having lower back pain.  He has had acupuncture and also has seen back specialist.  Patient states he had L5 fracture.  Polyarthralgia-he denies any history of joint swelling.  He has history of discomfort in his neck, lower back, shoulders and his knees due to previous sports injuries.  He also has discomfort over the [facility] joints.  No synovitis was noted on the examination.  Other fatigue - Plan: CK  Other medical problems are listed as follows:  Atherosclerosis of coronary artery bypass graft of native heart with stable angina pectoris - Double bypass 2021 and 4 stents February 2024  Mixed hyperlipidemia - On atorvastatin 40 mg p.o. daily  Type 2 diabetes mellitus with diabetic neuropathic arthropathy, without long-term current use of insulin (HCC)  Hereditary hemochromatosis - Followed by hematology  Anxiety and depression - On sertraline  Prostate enlargement  Oral candidiasis -he had coated tongue and also whitish plaques on his mouth.  No oral ulcers were noted.  I will send a  prescription for Magic mouthwash.  Plan: magic mouthwash SOLN  Onychomycosis  Orders: Orders Placed  This Encounter  Procedures   CBC with Differential/Platelet   Comprehensive metabolic panel with GFR   CK   Sedimentation rate   ANA   Anti-DNA antibody, double-stranded   Lupus Anticoagulant Eval w/Reflex   C3 and C4   Glucose 6 phosphate dehydrogenase   QuantiFERON-TB Gold Plus   Serum protein electrophoresis with reflex   IgG, IgA, IgM   Thiopurine methyltransferase(tpmt)rbc   Ambulatory referral to Dermatology   Ambulatory referral to Ophthalmology   Meds ordered this encounter  Medications   magic mouthwash SOLN    Sig: Take 5 mLs by mouth 4 (four) times daily. Swish and spit. Benadryl,Maalox, Hydrocortisone 1:1:1 ratio    Dispense:  240 mL    Refill:  0    Face-to-face time spent with patient was over 60 minutes. Greater than 50% of time was spent in counseling and coordination of care.  Follow-Up Instructions: Return for Systemic lupus.   Maya Nash, MD  Note - This record has been created using Animal nutritionist.  Chart creation errors have been sought, but may not always  have been located. Such creation errors do not reflect on  the standard of medical care.

## 2024-03-09 ENCOUNTER — Encounter: Payer: Self-pay | Admitting: Hematology and Oncology

## 2024-03-12 ENCOUNTER — Ambulatory Visit: Payer: Self-pay | Attending: Rheumatology | Admitting: Rheumatology

## 2024-03-12 ENCOUNTER — Encounter: Payer: Self-pay | Admitting: Rheumatology

## 2024-03-12 VITALS — BP 137/77 | HR 74 | Temp 98.6°F | Resp 16 | Ht 70.0 in | Wt 199.0 lb

## 2024-03-12 DIAGNOSIS — L932 Other local lupus erythematosus: Secondary | ICD-10-CM | POA: Diagnosis not present

## 2024-03-12 DIAGNOSIS — D72818 Other decreased white blood cell count: Secondary | ICD-10-CM | POA: Diagnosis not present

## 2024-03-12 DIAGNOSIS — E782 Mixed hyperlipidemia: Secondary | ICD-10-CM

## 2024-03-12 DIAGNOSIS — R5383 Other fatigue: Secondary | ICD-10-CM

## 2024-03-12 DIAGNOSIS — I25708 Atherosclerosis of coronary artery bypass graft(s), unspecified, with other forms of angina pectoris: Secondary | ICD-10-CM

## 2024-03-12 DIAGNOSIS — M2550 Pain in unspecified joint: Secondary | ICD-10-CM

## 2024-03-12 DIAGNOSIS — M47816 Spondylosis without myelopathy or radiculopathy, lumbar region: Secondary | ICD-10-CM

## 2024-03-12 DIAGNOSIS — Z79899 Other long term (current) drug therapy: Secondary | ICD-10-CM

## 2024-03-12 DIAGNOSIS — F419 Anxiety disorder, unspecified: Secondary | ICD-10-CM

## 2024-03-12 DIAGNOSIS — F32A Depression, unspecified: Secondary | ICD-10-CM

## 2024-03-12 DIAGNOSIS — R7689 Other specified abnormal immunological findings in serum: Secondary | ICD-10-CM | POA: Diagnosis not present

## 2024-03-12 DIAGNOSIS — D696 Thrombocytopenia, unspecified: Secondary | ICD-10-CM

## 2024-03-12 DIAGNOSIS — B370 Candidal stomatitis: Secondary | ICD-10-CM

## 2024-03-12 DIAGNOSIS — M5030 Other cervical disc degeneration, unspecified cervical region: Secondary | ICD-10-CM

## 2024-03-12 DIAGNOSIS — N400 Enlarged prostate without lower urinary tract symptoms: Secondary | ICD-10-CM

## 2024-03-12 DIAGNOSIS — E11610 Type 2 diabetes mellitus with diabetic neuropathic arthropathy: Secondary | ICD-10-CM

## 2024-03-12 DIAGNOSIS — B351 Tinea unguium: Secondary | ICD-10-CM

## 2024-03-12 MED ORDER — NYSTATIN 100000 UNIT/ML MT SUSP
5.0000 mL | Freq: Three times a day (TID) | OROMUCOSAL | 0 refills | Status: DC
Start: 2024-03-12 — End: 2024-04-07

## 2024-03-12 MED ORDER — HYDROXYCHLOROQUINE SULFATE 200 MG PO TABS: 200.0000 mg | ORAL_TABLET | Freq: Two times a day (BID) | ORAL | 0 refills | Status: AC

## 2024-03-12 MED ORDER — MAGIC MOUTHWASH: 5.0000 mL | Freq: Four times a day (QID) | ORAL | 0 refills | Status: DC

## 2024-03-12 NOTE — Patient Instructions (Addendum)
 Standing Labs We placed an order today for your standing lab work.   Please have your standing labs drawn in 1 month and then every 3 months.  Please have your labs drawn 2 weeks prior to your appointment so that the provider can discuss your lab results at your appointment, if possible.  Please note that you may see your imaging and lab results in MyChart before we have reviewed them. We will contact you once all results are reviewed. Please allow our office up to 72 hours to thoroughly review all of the results before contacting the office for clarification of your results.  WALK-IN LAB HOURS  Monday through Thursday from 8:00 am -12:30 pm and 1:00 pm-4:30 pm and Friday from 8:00 am-12:00 pm.  Patients with office visits requiring labs will be seen before walk-in labs.  You may encounter longer than normal wait times. Please allow additional time. Wait times may be shorter on  Monday and Thursday afternoons.  We do not book appointments for walk-in labs. We appreciate your patience and understanding with our staff.   Labs are drawn by [facility]. Please bring your co-pay at the time of your lab draw.  You may receive a bill from [facility] for your lab work.  Please note if you are on Hydroxychloroquine and and an order has been placed for a Hydroxychloroquine level,  you will need to have it drawn 4 hours or more after your last dose.  If you wish to have your labs drawn at another location, please call the office 24 hours in advance so we can fax the orders.  The office is located at [address], [address], [place], [place] [address]   If you have any questions regarding directions or hours of operation,  please call [phone].   As a reminder, please drink plenty of water prior to coming for your lab work. Thanks!  Vaccines You are taking a medication(s) that can suppress your immune system.  The following immunizations are recommended: Flu annually Covid-19  Td/Tdap (tetanus,  diphtheria, pertussis) every 10 years Pneumonia (Prevnar 15 then Pneumovax 23 at least 1 year apart.  Alternatively, can take Prevnar 20 without needing additional dose) Shingrix: 2 doses from 4 weeks to 6 months apart  Please check with your PCP to make sure you are up to date.   Hydroxychloroquine Tablets What is this medication? HYDROXYCHLOROQUINE (hye drox ee KLOR oh kwin) treats autoimmune conditions, such as rheumatoid arthritis and lupus. It works by slowing down an overactive immune system. It may also be used to prevent and treat malaria. It works by killing the parasite that causes malaria. It belongs to a group of medications called DMARDs. This medicine may be used for other purposes; ask your health care provider or pharmacist if you have questions. COMMON BRAND NAME(S): Plaquenil, Quineprox, SOVUNA What should I tell my care team before I take this medication? They need to know if you have any of these conditions: Diabetes Eye disease, vision problems Frequently drink alcohol G6PD deficiency Heart disease Irregular heartbeat or rhythm Kidney disease Liver disease Porphyria Psoriasis An unusual or allergic reaction to hydroxychloroquine, other medications, foods, dyes, or preservatives Pregnant or trying to get pregnant Breastfeeding How should I use this medication? Take this medication by mouth with water. Take it as directed on the prescription label. Do not cut, crush, or chew this medication. Swallow the tablets whole. Take it with food. Do not take it more than directed. Take all of this  medication unless your care team tells you to stop it early. Keep taking it even if you think you are better. Take products with antacids in them at a different time of day than this medication. Take this medication 4 hours before or 4 hours after antacids. Talk to your care team if you have questions. Talk to your care team about the use of this medication in children. While this  medication may be prescribed for selected conditions, precautions do apply. Overdosage: If you think you have taken too much of this medicine contact a poison control center or emergency room at once. NOTE: This medicine is only for you. Do not share this medicine with others. What if I miss a dose? If you miss a dose, take it as soon as you can. If it is almost time for your next dose, take only that dose. Do not take double or extra doses. What may interact with this medication? Do not take this medication with any of the following: Cisapride Dronedarone Pimozide Thioridazine This medication may also interact with the following: Ampicillin Antacids Cimetidine Cyclosporine Digoxin Kaolin Medications for diabetes, such as insulin, glipizide, glyburide Medications for seizures, such as carbamazepine, phenobarbital, phenytoin Mefloquine Methotrexate Other medications that cause heart rhythm changes Praziquantel This list may not describe all possible interactions. Give your health care provider a list of all the medicines, herbs, non-prescription drugs, or dietary supplements you use. Also tell them if you smoke, drink alcohol, or use illegal drugs. Some items may interact with your medicine. What should I watch for while using this medication? Visit your care team for regular checks on your progress. Tell your care team if your symptoms do not start to get better or if they get worse. You may need blood work done while you are taking this medication. If you take other medications that can affect heart rhythm, you may need more testing. Talk to your care team if you have questions. Your vision may be tested before and during use of this medication. Tell your care team right away if you have any change in your eyesight. This medication may cause serious skin reactions. They can happen weeks to months after starting the medication. Contact your care team right away if you notice fevers or  flu-like symptoms with a rash. The rash may be red or purple and then turn into blisters or peeling of the skin. Or, you might notice a red rash with swelling of the face, lips or lymph nodes in your neck or under your arms. If you or your family notice any changes in your behavior, such as new or worsening depression, thoughts of harming yourself, anxiety, or other unusual or disturbing thoughts, or memory loss, call your care team right away. What side effects may I notice from receiving this medication? Side effects that you should report to your care team as soon as possible: Allergic reactions--skin rash, itching, hives, swelling of the face, lips, tongue, or throat Aplastic anemia--unusual weakness or fatigue, dizziness, headache, trouble breathing, increased bleeding or bruising Change in vision Heart rhythm changes--fast or irregular heartbeat, dizziness, feeling faint or lightheaded, chest pain, trouble breathing Infection--fever, chills, cough, or sore throat Low blood sugar (hypoglycemia)--tremors or shaking, anxiety, sweating, cold or clammy skin, confusion, dizziness, rapid heartbeat Muscle injury--unusual weakness or fatigue, muscle pain, dark yellow or brown urine, decrease in amount of urine Pain, tingling, or numbness in the hands or feet Rash, fever, and swollen lymph nodes Redness, blistering, peeling, or loosening of  the skin, including inside the mouth Thoughts of suicide or self-harm, worsening mood, or feelings of depression Unusual bruising or bleeding Side effects that usually do not require medical attention (report to your care team if they continue or are bothersome): Diarrhea Headache Nausea Stomach pain Vomiting This list may not describe all possible side effects. Call your doctor for medical advice about side effects. You may report side effects to FDA at 1-800-FDA-1088. Where should I keep my medication? Keep out of the reach of children and pets. Store at  room temperature up to 30 degrees C (86 degrees F). Protect from light. Get rid of any unused medication after the expiration date. To get rid of medications that are no longer needed or have expired: Take the medication to a medication take-back program. Check with your pharmacy or law enforcement to find a location. If you cannot return the medication, check the label or package insert to see if the medication should be thrown out in the garbage or flushed down the toilet. If you are not sure, ask your care team. If it is safe to put it in the trash, empty the medication out of the container. Mix the medication with cat litter, dirt, coffee grounds, or other unwanted substance. Seal the mixture in a bag or container. Put it in the trash. NOTE: This sheet is a summary. It may not cover all possible information. If you have questions about this medicine, talk to your doctor, pharmacist, or health care provider.  2024 Elsevier/Gold Standard (2021-11-07 00:00:00)

## 2024-03-12 NOTE — Progress Notes (Signed)
 Pharmacy Note  Subjective: Patient presents today to [facility] for follow up office visit.   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  He was started on hydroxychloroquine by his PCP  Objective: CMP     Component Value Date/Time   NA 136 06/06/2014 1425   K 3.8 06/06/2014 1425   CL 103 06/06/2014 1425   CO2 26 06/06/2014 1425   GLUCOSE 134 (H) 06/06/2014 1425   BUN 13 06/06/2014 1425   CREATININE 1.02 06/06/2014 1425   CALCIUM 9.2 06/06/2014 1425   GFRNONAA 84 (L) 06/06/2014 1425   GFRAA >90 06/06/2014 1425    CBC    Component Value Date/Time   WBC 3.2 (L) 11/20/2023 1430   RBC 4.76 11/20/2023 1430   HGB 14.9 11/20/2023 1430   HGB 14.5 02/05/2023 1048   HCT 43.8 11/20/2023 1430   PLT 88 (L) 11/20/2023 1430   PLT 113 (L) 02/05/2023 1048   MCV 92.0 11/20/2023 1430   MCH 31.3 11/20/2023 1430   MCHC 34.0 11/20/2023 1430   RDW 13.6 11/20/2023 1430   LYMPHSABS 0.5 (L) 11/20/2023 1430   MONOABS 0.5 11/20/2023 1430   EOSABS 0.1 11/20/2023 1430   BASOSABS 0.0 11/20/2023 1430    Assessment/Plan: Patient was counseled on the purpose, proper use, and adverse effects of hydroxychloroquine including nausea/diarrhea, skin rash, headaches, and sun sensitivity.  Advised patient to wear sunscreen once starting hydroxychloroquine to reduce risk of rash associated with sun sensitivity.  Discussed importance of annual eye exams while on hydroxychloroquine to monitor to ocular toxicity and discussed importance of frequent laboratory monitoring.  Provided patient with eye exam form for baseline ophthalmologic exam.  Provided patient with educational materials on hydroxychloroquine and answered all questions.  Patient consented to hydroxychloroquine. Will upload consent in the media tab.    Reviewed risk for QTC prolongation when used in combination with other QTc prolonging agents (including but not limited to antiarrhythmics,  macrolide antibiotics, flouroquinolone antibiotics, haloperidol, quetiapine, olanzapine, risperidone, droperidol, ziprasidone, amitriptyline, citalopram, ondansetron , migraine triptans, and methadone).   Dose will be increase to Plaquenil 200 mg twice daily.  Prescription sent today  Ladana Chavero, PharmD, MPH, BCPS, CPP Clinical Pharmacist [facility])

## 2024-03-16 ENCOUNTER — Ambulatory Visit: Payer: Self-pay | Admitting: Rheumatology

## 2024-03-16 NOTE — Progress Notes (Signed)
 White cell count and platelets remain low, ANA is positive, TB Gold indeterminate.  Glucose is elevated.  All other labs are within normal limits.  I will discuss results at the follow-up visit.

## 2024-03-21 LAB — CBC WITH DIFFERENTIAL/PLATELET
Absolute Lymphocytes: 518 {cells}/uL — ABNORMAL LOW (ref 850–3900)
Absolute Monocytes: 389 {cells}/uL (ref 200–950)
Basophils Absolute: 40 {cells}/uL (ref 0–200)
Basophils Relative: 1.1 %
Eosinophils Absolute: 79 {cells}/uL (ref 15–500)
Eosinophils Relative: 2.2 %
HCT: 42.3 % (ref 38.5–50.0)
Hemoglobin: 14.2 g/dL (ref 13.2–17.1)
MCH: 31.2 pg (ref 27.0–33.0)
MCHC: 33.6 g/dL (ref 32.0–36.0)
MCV: 93 fL (ref 80.0–100.0)
MPV: 12.2 fL (ref 7.5–12.5)
Monocytes Relative: 10.8 %
Neutro Abs: 2574 {cells}/uL (ref 1500–7800)
Neutrophils Relative %: 71.5 %
Platelets: 114 Thousand/uL — ABNORMAL LOW (ref 140–400)
RBC: 4.55 Million/uL (ref 4.20–5.80)
RDW: 14.5 % (ref 11.0–15.0)
Total Lymphocyte: 14.4 %
WBC: 3.6 Thousand/uL — ABNORMAL LOW (ref 3.8–10.8)

## 2024-03-21 LAB — COMPREHENSIVE METABOLIC PANEL WITH GFR
AG Ratio: 1.8 (calc) (ref 1.0–2.5)
ALT: 44 U/L (ref 9–46)
AST: 30 U/L (ref 10–35)
Albumin: 4.6 g/dL (ref 3.6–5.1)
Alkaline phosphatase (APISO): 67 U/L (ref 35–144)
BUN/Creatinine Ratio: 15 (calc) (ref 6–22)
BUN: 10 mg/dL (ref 7–25)
CO2: 27 mmol/L (ref 20–32)
Calcium: 9.3 mg/dL (ref 8.6–10.3)
Chloride: 104 mmol/L (ref 98–110)
Creat: 0.65 mg/dL — ABNORMAL LOW (ref 0.70–1.35)
Globulin: 2.5 g/dL (ref 1.9–3.7)
Glucose, Bld: 150 mg/dL — ABNORMAL HIGH (ref 65–99)
Potassium: 4.2 mmol/L (ref 3.5–5.3)
Sodium: 140 mmol/L (ref 135–146)
Total Bilirubin: 0.5 mg/dL (ref 0.2–1.2)
Total Protein: 7.1 g/dL (ref 6.1–8.1)
eGFR: 108 mL/min/1.73m2 (ref >=60)

## 2024-03-21 LAB — ANTI-DNA ANTIBODY, DOUBLE-STRANDED: ds DNA Ab: 2 [IU]/mL

## 2024-03-21 LAB — IGG, IGA, IGM
IgG (Immunoglobin G), Serum: 1089 mg/dL (ref 600–1640)
IgM, Serum: 86 mg/dL (ref 50–300)
Immunoglobulin A: 269 mg/dL (ref 47–310)

## 2024-03-21 LAB — CK: Total CK: 96 U/L (ref 22–308)

## 2024-03-21 LAB — QUANTIFERON-TB GOLD PLUS
Mitogen-NIL: 0.13 [IU]/mL
NIL: 0.01 [IU]/mL
QuantiFERON-TB Gold Plus: UNDETERMINED — AB
TB1-NIL: 0 [IU]/mL
TB2-NIL: 0 [IU]/mL

## 2024-03-21 LAB — PROTEIN ELECTROPHORESIS, SERUM, WITH REFLEX
Albumin ELP: 4.4 g/dL (ref 3.8–4.8)
Alpha 1: 0.3 g/dL (ref 0.2–0.3)
Alpha 2: 0.7 g/dL (ref 0.5–0.9)
Beta 2: 0.4 g/dL (ref 0.2–0.5)
Beta Globulin: 0.4 g/dL (ref 0.4–0.6)
Gamma Globulin: 1 g/dL (ref 0.8–1.7)
Total Protein: 7.3 g/dL (ref 6.1–8.1)

## 2024-03-21 LAB — THIOPURINE METHYLTRANSFERASE (TPMT), RBC: Thiopurine Methyltransferase, RBC: 6 nmol/h/mL — ABNORMAL LOW

## 2024-03-21 LAB — ANTI-NUCLEAR AB-TITER (ANA TITER): ANA Titer 1: 1:640 {titer} — ABNORMAL HIGH

## 2024-03-21 LAB — C3 AND C4
C3 Complement: 152 mg/dL (ref 82–185)
C4 Complement: 31 mg/dL (ref 15–53)

## 2024-03-21 LAB — SEDIMENTATION RATE: Sed Rate: 17 mm/h (ref 0–20)

## 2024-03-21 LAB — GLUCOSE 6 PHOSPHATE DEHYDROGENASE: G-6PDH: 17.5 U/g{Hb} (ref 7.0–20.5)

## 2024-03-21 LAB — LUPUS ANTICOAGULANT EVAL W/ REFLEX
PTT-LA Screen: 35 s (ref <=40)
dRVVT: 36 s (ref <=45)

## 2024-03-21 LAB — ANA: Anti Nuclear Antibody (ANA): POSITIVE — AB

## 2024-03-26 NOTE — Progress Notes (Signed)
 Office Visit Note  Patient: Edward Phillips             Date of Birth: 11-Apr-1966           MRN: [id number]             PCP: Roanna Ezekiel NOVAK, MD Referring: Georgina Norleen ORN, DO Visit Date: 04/09/2024 Occupation: Data Unavailable  Subjective:  Rash  History of Present Illness: Edward Phillips is a 58 y.o. male with CLE. 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  He agreed with the treatment with Plaquenil  at increased dose of 400 mg daily.  Dr. Jorizzo also diagnosed him with rosacea and prescribed Zithromax, metronidazole and clindamycin 1% solution.  He also diagnosed with a steroid induced acne.  Patient states he has noticed improvement in the itching since has been on increased dose of hydroxychloroquine .  He is also using clindamycin solution.  He has not picked up metronidazole solution yet.  He denies any history of joint swelling.  He gives history of photosensitivity.  He states has been using sunscreen on a regular basis.   Activities of Daily Living:  Patient reports morning stiffness for 0 minutes.   Patient Reports nocturnal pain.  Difficulty dressing/grooming: Denies Difficulty climbing stairs: Denies Difficulty getting out of chair: Denies Difficulty using hands for taps, buttons, cutlery, and/or writing: Denies  Review of Systems  Constitutional:  Positive for fatigue.  HENT:  Negative for mouth sores and mouth dryness.   Eyes:  Negative for dryness.  Respiratory:  Negative for shortness of breath.   Cardiovascular:  Negative for chest pain and palpitations.  Gastrointestinal:  Negative for blood in stool, constipation and diarrhea.  Endocrine: Negative for increased urination.  Genitourinary:  Positive for urgency. Negative for involuntary urination.  Musculoskeletal:  Positive for joint pain, joint pain, myalgias and myalgias. Negative for gait problem, joint swelling, muscle weakness, morning stiffness and muscle tenderness.   Skin:  Positive for rash and sensitivity to sunlight. Negative for color change and hair loss.  Allergic/Immunologic: Negative for susceptible to infections.  Neurological:  Positive for headaches. Negative for dizziness.  Hematological:  Negative for swollen glands.  Psychiatric/Behavioral:  Negative for depressed mood and sleep disturbance. The patient is not nervous/anxious.     PMFS History:  Patient Active Problem List   Diagnosis Date Noted   Cutaneous lupus erythematosus 02/27/2024   Leucopenia 02/27/2024   Atherosclerosis of coronary artery bypass graft of native heart with stable angina pectoris 02/27/2024   Anxiety and depression 02/27/2024   Prostate enlargement 02/27/2024   Hemochromatosis 02/05/2023   Skin rash 02/05/2023   Thrombocytopenia 02/05/2023   Abnormal liver enzymes 02/05/2023   HYPERLIPIDEMIA 10/13/2008   Essential hypertension 10/13/2008    Past Medical History:  Diagnosis Date   Hyperlipidemia    Hypertension    Osteoarthritis    Rosacea 2025   Testosterone deficiency in male    Umbilical hernia     Family History  Problem Relation Age of Onset   Diabetes Mother    Heart disease Mother    Cancer Father        melanoma   Dementia Father    Cancer Sister        breast ca   Healthy Brother    Healthy Son    Healthy Daughter    Healthy Daughter    Past Surgical History:  Procedure Laterality Date   CARDIAC CATHETERIZATION  2000?   CORONARY ARTERY BYPASS  GRAFT     coronary stent     NASAL SEPTUM SURGERY  05/15/1981   WRIST SURGERY Left 05/15/1985   tendon surgery    Social History   Tobacco Use   Smoking status: Never    Passive exposure: Never   Smokeless tobacco: Never  Vaping Use   Vaping status: Never Used  Substance Use Topics   Alcohol use: Yes    Comment: rarely   Drug use: No   Social History   Social History Narrative   Not on file      There is no immunization history on file for this patient.    Objective: Vital Signs: BP (!) 147/81   Pulse 64   Temp 98 F (36.7 C)   Resp 16   Ht 5' 10 (1.778 m)   Wt 196 lb (88.9 kg)   BMI 28.12 kg/m    Physical Exam Vitals and nursing note reviewed.  Constitutional:      Appearance: He is well-developed.  HENT:     Head: Normocephalic and atraumatic.  Eyes:     Conjunctiva/sclera: Conjunctivae normal.     Pupils: Pupils are equal, round, and reactive to light.  Cardiovascular:     Rate and Rhythm: Normal rate and regular rhythm.     Heart sounds: Normal heart sounds.  Pulmonary:     Effort: Pulmonary effort is normal.     Breath sounds: Normal breath sounds.  Abdominal:     General: Bowel sounds are normal.     Palpations: Abdomen is soft.  Musculoskeletal:     Cervical back: Normal range of motion and neck supple.  Skin:    General: Skin is warm and dry.     Capillary Refill: Capillary refill takes less than 2 seconds.  Neurological:     Mental Status: He is alert and oriented to person, place, and time.  Psychiatric:        Behavior: Behavior normal.      Musculoskeletal Exam: He had limited lateral rotation of the cervical spine.  Thoracic and lumbar spine were in good range of motion.  There was no SI joint tenderness.  Shoulder joints, elbow joints, wrist joints, MCPs, PIPs and DIPs were in good range of motion with no synovitis.  Bilateral CMC thickening was noted.  Hip joints and knee joints were in good range of motion without any warmth swelling or effusion.  There was no tenderness over ankles or MTPs.   CDAI Exam: CDAI Score: -- Patient Global: --; Provider Global: -- Swollen: --; Tender: -- Joint Exam 04/09/2024   No joint exam has been documented for this visit   There is currently no information documented on the homunculus. Go to the Rheumatology activity and complete the homunculus joint exam.  Investigation: No additional findings.  Imaging: No results found.  Recent Labs: Lab Results   Component Value Date   WBC 3.6 (L) 03/12/2024   HGB 14.2 03/12/2024   PLT 114 (L) 03/12/2024   NA 140 03/12/2024   K 4.2 03/12/2024   CL 104 03/12/2024   CO2 27 03/12/2024   GLUCOSE 150 (H) 03/12/2024   BUN 10 03/12/2024   CREATININE 0.65 (L) 03/12/2024   BILITOT 0.5 03/12/2024   AST 30 03/12/2024   ALT 44 03/12/2024   PROT 7.1 03/12/2024   PROT 7.3 03/12/2024   CALCIUM 9.3 03/12/2024   GFRAA >90 06/06/2014   QFTBGOLDPLUS INDETERMINATE (A) 03/12/2024   March 12, 2024 SPEP normal, TB Gold indeterminate,  TPMT low at 6, G6PD normal, immunoglobulins normal, ANA 1: 640 NH, double-stranded DNA 2, C3-C4 normal, lupus anticoagulant negative, CK 96, sed rate 17  December 18, 2023 HDL 28, triglycerides 181, CBC WBC 3.0, hemoglobin 16.5, hematocrit 51.8, platelets 98,  TSH normal, CMP creatinine 0.82, AST 35, ALT 40 hemoglobin A1c 6.6, B12 328, folate 17, PSA elevated, ANA 1: 1280 homogeneous,, ENA (double-stranded DNA, chromatin, Smith, RNP, SSA, SSB, SCL 70, Jo 1, centromere, (negative, C3-C4 normal, anticardiolipin IgG and IgM negative, beta-2 GP 1 IgG and IgM negative, rheumatoid factor<5, anti-CCP<16, TPO antibody<1.0, HIV negative, hepatitis B and hepatitis C nonreactive   Speciality Comments: No specialty comments available.  Procedures:  No procedures performed Allergies: Penicillins   Assessment / Plan:     Visit Diagnoses: Cutaneous lupus erythematosus -skin biopsy positive.ANA 1: 640 NH, double-stranded DNA 2, C3-C4 normal, lupus anticoagulant negative, CK 96, sed rate 17.  Lab results were reviewed with the patient.  Patient was evaluated by Dr. Jorizzo who agreed with the diagnosis of CLE.  He recommended continuing Plaquenil  and he added topical agents.  Use of sunscreen was advised.  He has no clinical features of systemic lupus.  I will also check urine protein creatinine ratio today.  High risk medication use -he is on hydroxychloroquine  200 mg p.o. twice daily.  Will check  CBC and CMP today.  Information reimmunization was placed in the AVS.  Plan: CBC with Differential/Platelet, Comprehensive metabolic panel with GFR.March 12, 2024 SPEP normal, TB Gold indeterminate, TPMT low at 6, G6PD normal, immunoglobulins normal   TPMT intermediate metabolizer  Positive ANA (antinuclear antibody) - Positive ANA, ENA panel negative, complements normal.- Plan: Protein / creatinine ratio, urine  Other decreased white blood cell (WBC) count-patient is followed by hematology.  Will continue to monitor labs.  Thrombocytopenia-he has chronic thrombocytopenia which could be related to autoimmune disease.  DDD (degenerative disc disease), cervical-he has limited range of motion of the cervical spine.  Lumbar spondylosis-neck back pain.  Polyarthralgia-he continues to have discomfort in his spine mostly.  Other medical problems are listed as follows:  Atherosclerosis of coronary artery bypass graft of native heart with stable angina pectoris  Mixed hyperlipidemia  Type 2 diabetes mellitus with diabetic neuropathic arthropathy, without long-term current use of insulin (HCC)  Hereditary hemochromatosis  Prostate enlargement  Anxiety and depression  Oral candidiasis  Onychomycosis  Orders: Orders Placed This Encounter  Procedures   Protein / creatinine ratio, urine   CBC with Differential/Platelet   Comprehensive metabolic panel with GFR   No orders of the defined types were placed in this encounter.    Follow-Up Instructions: Return in about 3 months (around 07/10/2024) for CLE.   Maya Nash, MD  Note - This record has been created using Animal nutritionist.  Chart creation errors have been sought, but may not always  have been located. Such creation errors do not reflect on  the standard of medical care.

## 2024-03-31 ENCOUNTER — Encounter: Payer: Self-pay | Attending: Internal Medicine | Admitting: Dietician

## 2024-03-31 ENCOUNTER — Encounter: Payer: Self-pay | Admitting: Dietician

## 2024-03-31 DIAGNOSIS — E119 Type 2 diabetes mellitus without complications: Secondary | ICD-10-CM | POA: Insufficient documentation

## 2024-03-31 NOTE — Patient Instructions (Signed)
 Check your blood sugar each morning before eating or drinking (fasting). Look for numbers under 130 mg/dL Check your blood sugar 2 hours after you begin eating a meal. Look for numbers under 180 mg/dL at all times.  Your goal A1c is below 6.5%  Work towards eating three meals a day, about 5-6 hours apart!  Begin to recognize carbohydrates, proteins, and non-starchy vegetables in your food choices!  Begin to build your meals using the proportions of the Balanced Plate. First, select your carb choice(s) for the meal. Make this 25% of your meal. Next, select your source of protein to pair with your carb choice(s). Make this another 25% of your meal. Finally, complete your meal with a variety of non-starchy vegetables. Make this the remaining 50% of your meal.

## 2024-03-31 NOTE — Progress Notes (Signed)
 Diabetes Self-Management Education  Visit Type: First/Initial  Appt. Start Time: 1405 Appt. End Time: 1530  03/31/2024  Mr. Edward Phillips, identified by name and date of birth, is a 58 y.o. male with a diagnosis of Diabetes: Type 2.   ASSESSMENT Pt spouse, Sonny, present for appointment Pt reports taking Metformin @500mg  daily in the evening, previously dealt with GI distress that has since subsided. Pt reports losing 30 lbs intentionally from last November to June, lost job in June, lost 10 more pounds through July-August due to stress, states they found a new job and stress is much lower. Pt reports typically having a late breakfast and dinner meal most week days, eats 3 meals a day on the weekends. Pt reports having cutaneous lupus diagnosis in March, had to do multiple courses of steroids upon diagnosis, states they were unaware of hyperglycemia risk on steroid therapy. Pt reports dealing with chronic low back and neck pain as well. Pt reports burning sensation in their mouth when eating for last 7-8 months, is using magic mouthwash 2-3 times a day which providing relief.    Diabetes Self-Management Education - 03/27/24 0933       Visit Information   Visit Type First/Initial      Initial Visit   Diabetes Type Type 2      Psychosocial Assessment   Patient Belief/Attitude about Diabetes Motivated to manage diabetes    What is the hardest part about your diabetes right now, causing you the most concern, or is the most worrisome to you about your diabetes?   Making healty food and beverage choices;Getting support / problem solving    Self-care barriers None    Self-management support Doctor's office;Family    Other persons present Patient;Spouse/SO    Patient Concerns Nutrition/Meal planning;Glycemic Control;Monitoring;Healthy Lifestyle    Special Needs None    Preferred Learning Style No preference indicated    Learning Readiness Ready    How often do you need to have someone  help you when you read instructions, pamphlets, or other written materials from your doctor or pharmacy? 1 - Never      Pre-Education Assessment   Patient understands the diabetes disease and treatment process. Needs Instruction    Patient understands incorporating nutritional management into lifestyle. Needs Instruction    Patient undertands incorporating physical activity into lifestyle. Needs Instruction    Patient understands using medications safely. Needs Instruction    Patient understands monitoring blood glucose, interpreting and using results Needs Instruction    Patient understands prevention, detection, and treatment of acute complications. Needs Instruction    Patient understands prevention, detection, and treatment of chronic complications. Needs Instruction    Patient understands how to develop strategies to address psychosocial issues. Needs Instruction    Patient understands how to develop strategies to promote health/change behavior. Needs Instruction      Complications   Last HgB A1C per patient/outside source 6.6 %   12/2023   How often do you check your blood sugar? 3-4 times/day    Fasting Blood glucose range (mg/dL) 29-870;869-820    Postprandial Blood glucose range (mg/dL) 869-820;819-799    Are you checking your feet? Yes    How many days per week are you checking your feet? 3      Dietary Intake   Breakfast Coffee w/ cream and stevia, 2 scrambled eggs, 2 pieces of sausage    Lunch [employer] mini club w/ mayo/ mustard/bacon/lettuce, 1/2 bag Lays chips, Pepsi    Dinner [employer]  w/ balsamic vinegarette, Fried flounder, rice pilaf, Sweet tea    Snack (evening) Spoonful of peanut butter    Beverage(s) Coffee, Pepsi, Sweet Tea, wATER      Activity / Exercise   Activity / Exercise Type ADL's      Patient Education   Disease Pathophysiology Definition of diabetes, type 1 and 2, and the diagnosis of diabetes;Factors that contribute to the development of diabetes     Healthy Eating Role of diet in the treatment of diabetes and the relationship between the three main macronutrients and blood glucose level          Individualized Plan for Diabetes Self-Management Training:   Learning Objective:  Patient will have a greater understanding of diabetes self-management. Patient education plan is to attend individual and/or group sessions per assessed needs and concerns.   Plan:   Patient Instructions  Check your blood sugar each morning before eating or drinking (fasting). Look for numbers under 130 mg/dL Check your blood sugar 2 hours after you begin eating a meal. Look for numbers under 180 mg/dL at all times.  Your goal A1c is below 6.5%  Work towards eating three meals a day, about 5-6 hours apart!  Begin to recognize carbohydrates, proteins, and non-starchy vegetables in your food choices!  Begin to build your meals using the proportions of the Balanced Plate. First, select your carb choice(s) for the meal. Make this 25% of your meal. Next, select your source of protein to pair with your carb choice(s). Make this another 25% of your meal. Finally, complete your meal with a variety of non-starchy vegetables. Make this the remaining 50% of your meal.   Expected Outcomes:     Education material provided: Plate method, Carbohydrate counting, 1800 kcal sample menu  If problems or questions, patient to contact team via:  Phone and Email  Future DSME appointment:

## 2024-04-07 ENCOUNTER — Other Ambulatory Visit: Payer: Self-pay | Admitting: Rheumatology

## 2024-04-07 NOTE — Telephone Encounter (Signed)
 Last Fill: 03/12/2024  Next Visit: 04/09/2024  Last Visit: 03/12/2024  DX: Oral candidiasis   Current Dose per office note on 03/12/2024: he had coated tongue and also whitish plaques on his mouth. No oral ulcers were noted. I will send a prescription for Magic mouthwash.   Okay to refill magic mouthwash?

## 2024-04-08 ENCOUNTER — Encounter: Payer: Self-pay | Admitting: Rheumatology

## 2024-04-09 ENCOUNTER — Ambulatory Visit: Attending: Rheumatology | Admitting: Rheumatology

## 2024-04-09 ENCOUNTER — Encounter: Payer: Self-pay | Admitting: Rheumatology

## 2024-04-09 VITALS — BP 147/81 | HR 64 | Temp 98.0°F | Resp 16 | Ht 70.0 in | Wt 196.0 lb

## 2024-04-09 DIAGNOSIS — B351 Tinea unguium: Secondary | ICD-10-CM

## 2024-04-09 DIAGNOSIS — E8889 Other specified metabolic disorders: Secondary | ICD-10-CM

## 2024-04-09 DIAGNOSIS — M2550 Pain in unspecified joint: Secondary | ICD-10-CM

## 2024-04-09 DIAGNOSIS — M5030 Other cervical disc degeneration, unspecified cervical region: Secondary | ICD-10-CM

## 2024-04-09 DIAGNOSIS — N400 Enlarged prostate without lower urinary tract symptoms: Secondary | ICD-10-CM

## 2024-04-09 DIAGNOSIS — R7689 Other specified abnormal immunological findings in serum: Secondary | ICD-10-CM

## 2024-04-09 DIAGNOSIS — F419 Anxiety disorder, unspecified: Secondary | ICD-10-CM

## 2024-04-09 DIAGNOSIS — E782 Mixed hyperlipidemia: Secondary | ICD-10-CM

## 2024-04-09 DIAGNOSIS — B370 Candidal stomatitis: Secondary | ICD-10-CM

## 2024-04-09 DIAGNOSIS — E11610 Type 2 diabetes mellitus with diabetic neuropathic arthropathy: Secondary | ICD-10-CM

## 2024-04-09 DIAGNOSIS — F32A Depression, unspecified: Secondary | ICD-10-CM

## 2024-04-09 DIAGNOSIS — M47816 Spondylosis without myelopathy or radiculopathy, lumbar region: Secondary | ICD-10-CM

## 2024-04-09 DIAGNOSIS — I25708 Atherosclerosis of coronary artery bypass graft(s), unspecified, with other forms of angina pectoris: Secondary | ICD-10-CM

## 2024-04-09 DIAGNOSIS — Z79899 Other long term (current) drug therapy: Secondary | ICD-10-CM | POA: Diagnosis not present

## 2024-04-09 DIAGNOSIS — D696 Thrombocytopenia, unspecified: Secondary | ICD-10-CM

## 2024-04-09 DIAGNOSIS — L932 Other local lupus erythematosus: Secondary | ICD-10-CM

## 2024-04-09 DIAGNOSIS — D72818 Other decreased white blood cell count: Secondary | ICD-10-CM

## 2024-04-09 NOTE — Patient Instructions (Signed)

## 2024-04-10 LAB — CBC WITH DIFFERENTIAL/PLATELET
Absolute Lymphocytes: 600 {cells}/uL — ABNORMAL LOW (ref 850–3900)
Absolute Monocytes: 423 {cells}/uL (ref 200–950)
Basophils Absolute: 41 {cells}/uL (ref 0–200)
Basophils Relative: 1.4 %
Eosinophils Absolute: 78 {cells}/uL (ref 15–500)
Eosinophils Relative: 2.7 %
HCT: 44.5 % (ref 39.4–51.1)
Hemoglobin: 14.7 g/dL (ref 13.2–17.1)
MCH: 31.9 pg (ref 27.0–33.0)
MCHC: 33 g/dL (ref 31.6–35.4)
MCV: 96.5 fL (ref 81.4–101.7)
MPV: 11.7 fL (ref 7.5–12.5)
Monocytes Relative: 14.6 %
Neutro Abs: 1757 {cells}/uL (ref 1500–7800)
Neutrophils Relative %: 60.6 %
Platelets: 106 Thousand/uL — ABNORMAL LOW (ref 140–400)
RBC: 4.61 Million/uL (ref 4.20–5.80)
RDW: 14.5 % (ref 11.0–15.0)
Total Lymphocyte: 20.7 %
WBC: 2.9 Thousand/uL — ABNORMAL LOW (ref 3.8–10.8)

## 2024-04-10 LAB — COMPREHENSIVE METABOLIC PANEL WITH GFR
AG Ratio: 2 (calc) (ref 1.0–2.5)
ALT: 54 U/L — ABNORMAL HIGH (ref 9–46)
AST: 43 U/L — ABNORMAL HIGH (ref 10–35)
Albumin: 4.7 g/dL (ref 3.6–5.1)
Alkaline phosphatase (APISO): 63 U/L (ref 35–144)
BUN: 11 mg/dL (ref 7–25)
CO2: 28 mmol/L (ref 20–32)
Calcium: 9.5 mg/dL (ref 8.6–10.3)
Chloride: 102 mmol/L (ref 98–110)
Creat: 0.7 mg/dL (ref 0.70–1.35)
Globulin: 2.4 g/dL (ref 1.9–3.7)
Glucose, Bld: 101 mg/dL — ABNORMAL HIGH (ref 65–99)
Potassium: 4.5 mmol/L (ref 3.5–5.3)
Sodium: 140 mmol/L (ref 135–146)
Total Bilirubin: 0.5 mg/dL (ref 0.2–1.2)
Total Protein: 7.1 g/dL (ref 6.1–8.1)
eGFR: 105 mL/min/1.73m2 (ref >=60)

## 2024-04-10 LAB — PROTEIN / CREATININE RATIO, URINE
Creatinine, Urine: 89 mg/dL (ref 20–320)
Protein/Creat Ratio: 112 mg/g{creat} (ref 25–148)
Protein/Creatinine Ratio: 0.112 mg/mg{creat} (ref 0.025–0.148)
Total Protein, Urine: 10 mg/dL (ref 5–25)

## 2024-04-11 ENCOUNTER — Ambulatory Visit: Payer: Self-pay | Admitting: Rheumatology

## 2024-04-11 NOTE — Progress Notes (Signed)
 White cell count, lymphocyte count and platelets remain low.  Liver functions are elevated which could be due to his statin use.  Patient should avoid all NSAIDs and alcohol use.  Urine protein creatinine ratio is normal.  Repeat labs in 3 months.  Please forward results to his PCP.

## 2024-04-16 ENCOUNTER — Telehealth: Payer: Self-pay | Admitting: *Deleted

## 2024-04-16 NOTE — Telephone Encounter (Signed)
 Pt wife called and stated that she had some concerns about recent labs that were done with Dr. Monette office. She had concerns of platelet counts decreasing and considering that he is now a type 2 diabetic along with possible lupus that they feel that he needs a f/u appt. Advised that provider will be made aware that there are new labs available and someone will f/u with recommendations. Pt wife verbalized understanding

## 2024-04-17 ENCOUNTER — Telehealth: Payer: Self-pay

## 2024-04-17 NOTE — Telephone Encounter (Signed)
 Returned his call from yesterday. Per Dr. Lonn, He is just being started on medication by rheumatologist, it takes time for medicine to work. His platelet is still over 100, we typically do not see problems until it drops to 50. I can see him next month, but not sooner to allow his medications to work. Scheduled appt on 1/8, he is aware of appt.

## 2024-05-15 ENCOUNTER — Encounter: Payer: Self-pay | Admitting: Hematology and Oncology

## 2024-05-22 ENCOUNTER — Encounter: Payer: Self-pay | Admitting: Hematology and Oncology

## 2024-05-22 ENCOUNTER — Other Ambulatory Visit: Payer: Self-pay | Admitting: Hematology and Oncology

## 2024-05-22 ENCOUNTER — Inpatient Hospital Stay (HOSPITAL_BASED_OUTPATIENT_CLINIC_OR_DEPARTMENT_OTHER): Admitting: Hematology and Oncology

## 2024-05-22 ENCOUNTER — Encounter: Payer: Self-pay | Admitting: Rheumatology

## 2024-05-22 ENCOUNTER — Inpatient Hospital Stay: Attending: Hematology and Oncology

## 2024-05-22 DIAGNOSIS — D696 Thrombocytopenia, unspecified: Secondary | ICD-10-CM | POA: Diagnosis not present

## 2024-05-22 LAB — CBC WITH DIFFERENTIAL (CANCER CENTER ONLY)
Abs Immature Granulocytes: 0 K/uL (ref 0.00–0.07)
Basophils Absolute: 0 K/uL (ref 0.0–0.1)
Basophils Relative: 1 %
Eosinophils Absolute: 0.1 K/uL (ref 0.0–0.5)
Eosinophils Relative: 2 %
HCT: 39.1 % (ref 39.0–52.0)
Hemoglobin: 13.7 g/dL (ref 13.0–17.0)
Immature Granulocytes: 0 %
Lymphocytes Relative: 21 %
Lymphs Abs: 0.6 K/uL — ABNORMAL LOW (ref 0.7–4.0)
MCH: 32.5 pg (ref 26.0–34.0)
MCHC: 35 g/dL (ref 30.0–36.0)
MCV: 92.9 fL (ref 80.0–100.0)
Monocytes Absolute: 0.3 K/uL (ref 0.1–1.0)
Monocytes Relative: 12 %
Neutro Abs: 1.8 K/uL (ref 1.7–7.7)
Neutrophils Relative %: 64 %
Platelet Count: 109 K/uL — ABNORMAL LOW (ref 150–400)
RBC: 4.21 MIL/uL — ABNORMAL LOW (ref 4.22–5.81)
RDW: 13.1 % (ref 11.5–15.5)
WBC Count: 2.7 K/uL — ABNORMAL LOW (ref 4.0–10.5)
nRBC: 0 % (ref 0.0–0.2)

## 2024-05-22 LAB — FERRITIN: Ferritin: 516 ng/mL — ABNORMAL HIGH (ref 24–336)

## 2024-05-22 NOTE — Assessment & Plan Note (Addendum)
 After several sessions of phlebotomy, his ferritin is staying low He does not need phlebotomy again unless his ferritin is over 250 Repeat ferritin level is pending, we will call him with test results

## 2024-05-22 NOTE — Assessment & Plan Note (Addendum)
 The patient is now found to have lupus This is a likely cause of his chronic thrombocytopenia and leukopenia From the hematology perspective, he does not need treatment for leukopenia and thrombocytopenia He has adequate neutrophil count and is on as his platelet count is above 100,000, he does not need to be treated

## 2024-05-22 NOTE — Progress Notes (Signed)
 [facility] OFFICE PROGRESS NOTE  Patient Care Team: Roanna Ezekiel NOVAK, MD as PCP - General (Internal Medicine)  Assessment & Plan Hemochromatosis, unspecified hemochromatosis type After several sessions of phlebotomy, his ferritin is staying low He does not need phlebotomy again unless his ferritin is over 250 Repeat ferritin level is pending, we will call him with test results Thrombocytopenia The patient is now found to have lupus This is a likely cause of his chronic thrombocytopenia and leukopenia From the hematology perspective, he does not need treatment for leukopenia and thrombocytopenia He has adequate neutrophil count and is on as his platelet count is above 100,000, he does not need to be treated   No orders of the defined types were placed in this encounter.    Almarie Bedford, MD  INTERVAL HISTORY: he returns for surveillance follow-up for thrombocytopenia and hemochromatosis The patient was diagnosed with lupus and is currently on treatment He was also started on metformin for diabetes He has lost a lot of weight since the last visit He has occasional mucositis No recent infection  PHYSICAL EXAMINATION: ECOG PERFORMANCE STATUS: 0 - Asymptomatic  Vitals:   05/22/24 1428  BP: 139/77  Pulse: 73  Resp: 18  Temp: 98.7 F (37.1 C)  SpO2: 100%   Filed Weights   05/22/24 1428  Weight: 198 lb 3.2 oz (89.9 kg)    Relevant data reviewed during this visit included CBC, ferritin level

## 2024-05-23 ENCOUNTER — Telehealth: Payer: Self-pay

## 2024-05-23 LAB — OPHTHALMOLOGY REPORT-SCANNED: A Comment: NORMAL

## 2024-05-23 NOTE — Telephone Encounter (Signed)
-----   Message from Almarie Bedford, MD sent at 05/23/2024  8:31 AM EST ----- His ferritin is high I am almost sure blood bank might not allow donation because he is on plaquenil  but I could be wrong Ask him to go and ask [facility] If [facility] allows donation, ask him to donate once every other month If he cannot donate blood, we need to schedule it here every other month with lab

## 2024-05-23 NOTE — Telephone Encounter (Signed)
Called and left a message asking him to call the office back.

## 2024-05-26 ENCOUNTER — Other Ambulatory Visit: Payer: Self-pay | Admitting: Rheumatology

## 2024-05-26 NOTE — Telephone Encounter (Signed)
 Called and left a message asking him to call the office back to get message from Dr. Lonn.

## 2024-06-05 ENCOUNTER — Telehealth: Payer: Self-pay | Admitting: Hematology and Oncology

## 2024-06-05 NOTE — Telephone Encounter (Signed)
 Sent a scheduling message to schedule appts.

## 2024-06-05 NOTE — Telephone Encounter (Signed)
 Left vm for pt to call back and schedule appts requested per Dr. Lonn

## 2024-06-12 ENCOUNTER — Telehealth: Payer: Self-pay | Admitting: Hematology and Oncology

## 2024-06-12 ENCOUNTER — Encounter: Payer: Self-pay | Admitting: Rheumatology

## 2024-06-12 NOTE — Telephone Encounter (Signed)
 I have not evaluated this patient. Reviewed Dr. Jammie note from October 2025--documented concern for oral candidiasis, which would require treatment with an antifungal not an antibiotic.  If he is having an inadequate response to magic mouthwash I would recommend evaluation for thrush--please see if patient can be seen by PCP or urgent care.

## 2024-06-12 NOTE — Telephone Encounter (Signed)
 Left vm for pt to call back to schedule f/u appts

## 2024-06-16 ENCOUNTER — Encounter: Admitting: Dietician

## 2024-06-17 ENCOUNTER — Ambulatory Visit: Payer: Self-pay | Admitting: Rheumatology

## 2024-06-18 ENCOUNTER — Other Ambulatory Visit: Payer: Self-pay

## 2024-06-18 ENCOUNTER — Inpatient Hospital Stay

## 2024-06-18 LAB — CBC WITH DIFFERENTIAL (CANCER CENTER ONLY)
Abs Immature Granulocytes: 0.01 10*3/uL (ref 0.00–0.07)
Basophils Absolute: 0 10*3/uL (ref 0.0–0.1)
Basophils Relative: 1 %
Eosinophils Absolute: 0.1 10*3/uL (ref 0.0–0.5)
Eosinophils Relative: 4 %
HCT: 39.9 % (ref 39.0–52.0)
Hemoglobin: 13.8 g/dL (ref 13.0–17.0)
Immature Granulocytes: 0 %
Lymphocytes Relative: 23 %
Lymphs Abs: 0.7 10*3/uL (ref 0.7–4.0)
MCH: 31.8 pg (ref 26.0–34.0)
MCHC: 34.6 g/dL (ref 30.0–36.0)
MCV: 91.9 fL (ref 80.0–100.0)
Monocytes Absolute: 0.3 10*3/uL (ref 0.1–1.0)
Monocytes Relative: 11 %
Neutro Abs: 1.8 10*3/uL (ref 1.7–7.7)
Neutrophils Relative %: 61 %
Platelet Count: 92 10*3/uL — ABNORMAL LOW (ref 150–400)
RBC: 4.34 MIL/uL (ref 4.22–5.81)
RDW: 13.2 % (ref 11.5–15.5)
WBC Count: 2.9 10*3/uL — ABNORMAL LOW (ref 4.0–10.5)
nRBC: 0 % (ref 0.0–0.2)

## 2024-06-18 LAB — FERRITIN: Ferritin: 564 ng/mL — ABNORMAL HIGH (ref 24–336)

## 2024-06-18 NOTE — Progress Notes (Signed)
 Edward Phillips presents today for phlebotomy per MD orders. Phlebotomy procedure started at 1357 and ended at 1401. 16G phlebotomy kit used to L cephalic vein. 519 cc removed. Patient tolerated procedure well. IV needle removed intact. Beverage and snack provided

## 2024-06-19 ENCOUNTER — Other Ambulatory Visit: Payer: Self-pay | Admitting: Hematology and Oncology

## 2024-06-19 ENCOUNTER — Telehealth: Payer: Self-pay

## 2024-06-19 NOTE — Telephone Encounter (Signed)
 Called and given below message. He verbalized understanding. Sent scheduling message to schedule.

## 2024-06-19 NOTE — Telephone Encounter (Signed)
-----   Message from Almarie Bedford, MD sent at 06/19/2024  8:32 AM EST ----- Ferritin is still high Schedule 1 more treatment in March in about 4 weeks with labs and phlebotomy Keep the rest of his appt

## 2024-07-10 ENCOUNTER — Ambulatory Visit: Admitting: Rheumatology

## 2024-07-16 ENCOUNTER — Inpatient Hospital Stay

## 2024-08-20 ENCOUNTER — Inpatient Hospital Stay

## 2024-11-20 ENCOUNTER — Inpatient Hospital Stay: Admitting: Hematology and Oncology

## 2024-11-20 ENCOUNTER — Inpatient Hospital Stay
